# Patient Record
Sex: Male | Born: 1945 | Race: White | Hispanic: No | Marital: Married | Smoking: Never smoker
Health system: Southern US, Community
[De-identification: ages and names within clinical notes are randomized; demographics above are authoritative.]

## PROBLEM LIST (undated history)

## (undated) DIAGNOSIS — E785 Hyperlipidemia, unspecified: Secondary | ICD-10-CM

## (undated) DIAGNOSIS — E739 Lactose intolerance, unspecified: Secondary | ICD-10-CM

## (undated) DIAGNOSIS — H409 Unspecified glaucoma: Secondary | ICD-10-CM

## (undated) DIAGNOSIS — K219 Gastro-esophageal reflux disease without esophagitis: Secondary | ICD-10-CM

## (undated) DIAGNOSIS — I10 Essential (primary) hypertension: Secondary | ICD-10-CM

## (undated) DIAGNOSIS — I2510 Atherosclerotic heart disease of native coronary artery without angina pectoris: Secondary | ICD-10-CM

## (undated) DIAGNOSIS — H269 Unspecified cataract: Secondary | ICD-10-CM

## (undated) DIAGNOSIS — M1990 Unspecified osteoarthritis, unspecified site: Secondary | ICD-10-CM

## (undated) HISTORY — DX: Hyperlipidemia, unspecified: E78.5

## (undated) HISTORY — DX: Lactose intolerance, unspecified: E73.9

## (undated) HISTORY — DX: Unspecified cataract: H26.9

## (undated) HISTORY — PX: APPENDECTOMY: SHX54

## (undated) HISTORY — DX: Unspecified glaucoma: H40.9

## (undated) HISTORY — PX: EYE SURGERY: SHX253

## (undated) HISTORY — DX: Atherosclerotic heart disease of native coronary artery without angina pectoris: I25.10

## (undated) HISTORY — DX: Gastro-esophageal reflux disease without esophagitis: K21.9

## (undated) HISTORY — DX: Unspecified osteoarthritis, unspecified site: M19.90

## (undated) HISTORY — DX: Essential (primary) hypertension: I10

---

## 1999-01-28 ENCOUNTER — Encounter (INDEPENDENT_AMBULATORY_CARE_PROVIDER_SITE_OTHER): Payer: Self-pay | Admitting: Specialist

## 1999-01-28 ENCOUNTER — Other Ambulatory Visit: Admission: RE | Admit: 1999-01-28 | Discharge: 1999-01-28 | Payer: Self-pay | Admitting: Otolaryngology

## 2000-06-20 ENCOUNTER — Encounter: Admission: RE | Admit: 2000-06-20 | Discharge: 2000-06-20 | Payer: Self-pay | Admitting: Gastroenterology

## 2000-06-20 ENCOUNTER — Encounter: Payer: Self-pay | Admitting: Gastroenterology

## 2000-09-01 ENCOUNTER — Ambulatory Visit (HOSPITAL_COMMUNITY): Admission: RE | Admit: 2000-09-01 | Discharge: 2000-09-01 | Payer: Self-pay | Admitting: Gastroenterology

## 2003-12-19 ENCOUNTER — Ambulatory Visit: Payer: Self-pay | Admitting: Internal Medicine

## 2004-08-25 ENCOUNTER — Ambulatory Visit: Payer: Self-pay | Admitting: Internal Medicine

## 2004-12-31 ENCOUNTER — Ambulatory Visit: Payer: Self-pay | Admitting: Internal Medicine

## 2006-01-13 ENCOUNTER — Ambulatory Visit: Payer: Self-pay | Admitting: Internal Medicine

## 2006-01-20 LAB — CONVERTED CEMR LAB
Rheumatoid Fact: <20 intl units/mL — ABNORMAL LOW (ref 0.0–20.0)
Sed Rate: 4 mm/hr (ref 0–20)
Uric Acid, Serum: 6.8 mg/dL (ref 2.4–7.0)

## 2006-07-02 ENCOUNTER — Encounter: Payer: Self-pay | Admitting: Family Medicine

## 2006-09-29 ENCOUNTER — Ambulatory Visit: Payer: Self-pay | Admitting: Family Medicine

## 2007-05-29 ENCOUNTER — Ambulatory Visit: Payer: Self-pay | Admitting: Family Medicine

## 2008-02-01 ENCOUNTER — Ambulatory Visit: Payer: Self-pay | Admitting: Family Medicine

## 2008-02-15 HISTORY — PX: COLONOSCOPY: SHX174

## 2008-12-19 ENCOUNTER — Ambulatory Visit: Payer: Self-pay | Admitting: Family Medicine

## 2010-01-22 ENCOUNTER — Ambulatory Visit: Payer: Self-pay | Admitting: Family Medicine

## 2010-06-23 ENCOUNTER — Other Ambulatory Visit: Payer: Self-pay | Admitting: Gastroenterology

## 2010-06-25 ENCOUNTER — Other Ambulatory Visit: Payer: Self-pay

## 2010-07-02 ENCOUNTER — Ambulatory Visit
Admission: RE | Admit: 2010-07-02 | Discharge: 2010-07-02 | Disposition: A | Discharge status: Home / Self Care | Payer: BC Managed Care – PPO | Source: Ambulatory Visit | Attending: Gastroenterology | Admitting: Gastroenterology

## 2010-07-02 NOTE — Procedures (Signed)
[place]  Patient:    Jon Morales, Jon Morales                          MRN: [id number] Proc. Date: 09/01/00 Adm. Date:  [id number] Attending:  Orland Mustard CC:         Dr. Hortense Ramal. Alwyn Ren, M.D. [facility]   Procedure Report  PROCEDURE PERFORMED:  Esophagogastroduodenscopy.  ENDOSCOPIST:  Llana Aliment. Edwards, M.D.  MEDICATIONS:  Hurricaine spray, fentanyl 50 mcg, Versed 5 mg IV.  INDICATIONS:  Persistent dyspepsia.  DESCRIPTION OF PROCEDURE:  The procedure had been explained to the patient and consent obtained.  With the patient in the left lateral decubitus position, the Olympus adult video endoscope was inserted blindly in the esophagus and advanced under direct visualization.  The stomach was entered and the pylorus identified and passed.  The duodenum including the bulb and second portion were seen well and were unremarkable.  The scope was withdrawn back into the stomach.  The pyloric channel, antrum and body were all seen well and were normal.  No ulceration or masses.  Fundus and cardia seen on retroflex view and was normal.  The diaphragm approximately was at 40 cm.  There was approximately a 3 to 5 cm hiatal hernia with a patent GE junction.  The distal esophagus was endoscopically normal.  No ulceration, no evidence of Barretts esophagus.  The patient tolerated the procedure quite well, was maintained on low-flow oxygen and pulse oximeter throughout the procedure. ASSESSMENT:  Hiatal hernia with gastroesophageal reflux.  PLAN: Continue patient on Aciphex, antireflux instructions and see back in the office in six months. DD:  09/01/00 TD:  09/01/00 Job: [id number]

## 2010-09-30 ENCOUNTER — Encounter (HOSPITAL_COMMUNITY): Payer: BC Managed Care – PPO

## 2010-09-30 ENCOUNTER — Other Ambulatory Visit (INDEPENDENT_AMBULATORY_CARE_PROVIDER_SITE_OTHER): Payer: Self-pay | Admitting: Surgery

## 2010-09-30 LAB — SURGICAL PCR SCREEN
MRSA, PCR: NEGATIVE
Staphylococcus aureus: POSITIVE — AB

## 2010-10-06 ENCOUNTER — Ambulatory Visit (HOSPITAL_COMMUNITY)
Admission: RE | Admit: 2010-10-06 | Discharge: 2010-10-06 | Disposition: A | Discharge status: Home / Self Care | Payer: BC Managed Care – PPO | Source: Ambulatory Visit | Attending: Surgery | Admitting: Surgery

## 2010-10-06 DIAGNOSIS — Z01812 Encounter for preprocedural laboratory examination: Secondary | ICD-10-CM | POA: Insufficient documentation

## 2010-10-06 DIAGNOSIS — K449 Diaphragmatic hernia without obstruction or gangrene: Secondary | ICD-10-CM | POA: Insufficient documentation

## 2010-10-06 DIAGNOSIS — K4090 Unilateral inguinal hernia, without obstruction or gangrene, not specified as recurrent: Secondary | ICD-10-CM | POA: Insufficient documentation

## 2010-10-06 DIAGNOSIS — Z9089 Acquired absence of other organs: Secondary | ICD-10-CM | POA: Insufficient documentation

## 2010-10-06 HISTORY — PX: HERNIA REPAIR: SHX51

## 2010-10-07 NOTE — Op Note (Signed)
  NAMEMANUAL, NAVARRA NO.:  [id number]  MEDICAL RECORD NO.:  [id number]  LOCATION:  DAYL                         FACILITY:  [facility]  PHYSICIAN:  Velora Heckler, MD      DATE OF BIRTH:  1945-02-17  DATE OF PROCEDURE:  10/06/2010                               OPERATIVE REPORT   PREOPERATIVE DIAGNOSIS:  Left inguinal hernia.  POSTOPERATIVE DIAGNOSIS:  Left inguinal hernia.  PROCEDURE:  Repair of left inguinal hernia with Pro-Grip mesh.  SURGEON:  Velora Heckler, M.D., FACS  ANESTHESIA:  General.  ESTIMATED BLOOD LOSS:  Minimal.  PREPARATION:  ChloraPrep.  COMPLICATIONS:  None.  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  This has been present for approximately 10 years.  It has gradually increased in size and became mildly symptomatic.  The patient now presents for repair.  BODY OF REPORT:  Procedure was done in OR #6 at the [facility].  The patient was brought to the operating room, placed in supine position on the operating room table.  Following administration of general anesthesia, the patient was prepped and draped in the usual strict aseptic fashion.  After ascertaining that an adequate level of anesthesia been achieved, a left groin incision was made with a #15 blade.  Dissection was carried through subcutaneous tissues and hemostasis obtained with the electrocautery.  External oblique fascia was incised in line of its fibers and extended through the external inguinal ring.  Cord structures were dissected out of the inguinal canal and encircled with a Penrose drain for the inguinal canal was dissected out.  There was a moderate direct inguinal hernia, which was reduced and held in reduction with a 2-0 silk suture.  Next, the cord was explored.  A small lipoma was excised.  There was no evidence of indirect inguinal hernia sac.  Floor of the inguinal canal was  then recreated with a sheet of Pro-Grip mesh.  Mesh was secured to the pubic tubercle with a 2-0 Novofil suture. Inferior edge of the mesh was secured to the inguinal ligament with interrupted 2-0 Novofil sutures.  Mesh was deployed around the cord and laterally.  There was good approximation to the muscular wall.  Local field block was placed with Exparel.  External oblique fascia was closed with interrupted 3-0 Vicryl sutures.  Subcutaneous tissues were closed with interrupted 3-0 Vicryl sutures.  Skin was anesthetized with Exparel.  Skin edges were reapproximated with a running 4-0 Vicryl subcuticular suture.  Wound was washed and dried, and Benzoin and Steri- Strips were applied.  Sterile dressings were applied.  The patient was awakened from anesthesia and brought to the recovery room.  The patient tolerated the procedure well.   Velora Heckler, MD, FACS     TMG/MEDQ  D:  10/06/2010  T:  10/06/2010  Job:  [id number]  Electronically Signed by Darnell Level MD on 10/07/2010 02:14:48 PM

## 2010-10-19 ENCOUNTER — Encounter (INDEPENDENT_AMBULATORY_CARE_PROVIDER_SITE_OTHER): Payer: Self-pay | Admitting: Surgery

## 2010-10-19 ENCOUNTER — Ambulatory Visit (INDEPENDENT_AMBULATORY_CARE_PROVIDER_SITE_OTHER): Payer: BC Managed Care – PPO | Admitting: Surgery

## 2010-10-19 VITALS — BP 105/98 | HR 60

## 2010-10-19 DIAGNOSIS — K4090 Unilateral inguinal hernia, without obstruction or gangrene, not specified as recurrent: Secondary | ICD-10-CM

## 2010-10-19 NOTE — Patient Instructions (Signed)
  COCOA BUTTER & VITAMIN E CREAM  ([employer] or other brand)  Apply cocoa butter/vitamin E cream to your incision 2 - 3 times daily.  Massage cream into incision for one minute with each application.  Use sunscreen (50 SPF or higher) for first 6 months after surgery.  You may substitute Mederma or other scar reducing creams as desired.   

## 2010-10-19 NOTE — Progress Notes (Signed)
Visit Diagnoses: 1. Inguinal hernia unilateral, non-recurrent     HISTORY: Patient returns for her first postoperative visit. He had problems initially with urinary retention but this resolved after 2 days of catheterization.   EXAM: Surgical wound is well-healed. Moderate soft tissue swelling. No sign of infection. With Valsalva there is no sign of recurrence.   IMPRESSION: Status post left inguinal hernia repair with mesh   PLAN: Patient will begin increasing his activity level. His lifting is restricted to 20 pounds. He will return to see me for a final wound check in 6 weeks.   Velora Heckler, MD, FACS General & Endocrine Surgery [facility]

## 2010-12-06 ENCOUNTER — Ambulatory Visit (INDEPENDENT_AMBULATORY_CARE_PROVIDER_SITE_OTHER): Payer: BC Managed Care – PPO | Admitting: Surgery

## 2010-12-06 ENCOUNTER — Encounter (INDEPENDENT_AMBULATORY_CARE_PROVIDER_SITE_OTHER): Payer: Self-pay | Admitting: Surgery

## 2010-12-06 VITALS — BP 138/88 | HR 80 | Temp 97.3°F | Resp 20 | Ht 70.0 in | Wt 166.1 lb

## 2010-12-06 DIAGNOSIS — K4090 Unilateral inguinal hernia, without obstruction or gangrene, not specified as recurrent: Secondary | ICD-10-CM

## 2010-12-06 NOTE — Patient Instructions (Signed)
  COCOA BUTTER & VITAMIN E CREAM  ([employer] or other brand)  Apply cocoa butter/vitamin E cream to your incision 2 - 3 times daily.  Massage cream into incision for one minute with each application.  Use sunscreen (50 SPF or higher) for first 6 months after surgery.  You may substitute Mederma or other scar reducing creams as desired.   

## 2010-12-06 NOTE — Progress Notes (Signed)
Visit Diagnoses: 1. Inguinal hernia unilateral, non-recurrent     HISTORY: Patient returns for a final wound check having undergone left inguinal hernia repair with mesh. Patient has minor discomfort in the left groin with bending at the waist. He notes that his bowel movements have improved. He denies any sharp pain or discomfort.   EXAM: Surgical wound is well healed. No sign of infection. Minimal swelling. Outpatient in the inguinal canal with cough and Valsalva shows no sign of recurrent hernia.   IMPRESSION: Status post left inguinal hernia repair with mesh   PLAN: Patient will apply topical creams to the incision. He is unrestricted in his activities at this point. He will return to see me as needed.   Velora Heckler, MD, FACS General & Endocrine Surgery [facility]

## 2011-02-18 ENCOUNTER — Encounter: Payer: Self-pay | Admitting: Internal Medicine

## 2011-02-25 ENCOUNTER — Ambulatory Visit (INDEPENDENT_AMBULATORY_CARE_PROVIDER_SITE_OTHER): Payer: BC Managed Care – PPO | Admitting: Family Medicine

## 2011-02-25 ENCOUNTER — Encounter: Payer: Self-pay | Admitting: Family Medicine

## 2011-02-25 VITALS — BP 122/82 | HR 82 | Ht 70.0 in | Wt 162.0 lb

## 2011-02-25 DIAGNOSIS — E739 Lactose intolerance, unspecified: Secondary | ICD-10-CM

## 2011-02-25 DIAGNOSIS — R160 Hepatomegaly, not elsewhere classified: Secondary | ICD-10-CM

## 2011-02-25 DIAGNOSIS — K219 Gastro-esophageal reflux disease without esophagitis: Secondary | ICD-10-CM

## 2011-02-25 DIAGNOSIS — Z0000 Encounter for general adult medical examination without abnormal findings: Secondary | ICD-10-CM

## 2011-02-25 LAB — POC HEMOCCULT BLD/STL (OFFICE/1-CARD/DIAGNOSTIC): Fecal Occult Blood, POC: NEGATIVE

## 2011-02-25 NOTE — Progress Notes (Signed)
  Subjective:    Patient ID: Jon Morales, male    DOB: Aug 18, 1945, 66 y.o.   MRN: [id number]  HPI He is here for complete examination. He has no particular concerns or complaints. Does have occasional difficulty with reflux symptoms. He did have internal hemorrhoid banding done earlier this year. He also has a history of lactose intolerance. He is now working 3-1/2 days per week. Jon Morales is going well.   Review of Systems  Constitutional: Negative.   HENT: Negative.   Eyes: Negative.   Respiratory: Negative.   Cardiovascular: Negative.   Gastrointestinal: Negative.   Genitourinary: Negative.   Musculoskeletal: Negative.   Skin: Negative.   Neurological: Negative.   Hematological: Negative.   Psychiatric/Behavioral: Negative.        Objective:   Physical Exam BP 122/82  Pulse 82  Ht 5\' 10"  (1.778 m)  Wt 162 lb (73.483 kg)  BMI 23.24 kg/m2  General Appearance:    Alert, cooperative, no distress, appears stated age  Head:    Normocephalic, without obvious abnormality, atraumatic  Eyes:    PERRL, conjunctiva/corneas clear, EOM's intact, fundi    benign  Ears:    Normal TM's and external ear canals  Nose:   Nares normal, mucosa normal, no drainage or sinus   tenderness  Throat:   Lips, mucosa, and tongue normal; teeth and gums normal  Neck:   Supple, no lymphadenopathy;  thyroid:  no   enlargement/tenderness/nodules; no carotid   bruit or JVD  Back:    Spine nontender, no curvature, ROM normal, no CVA     tenderness  Lungs:     Clear to auscultation bilaterally without wheezes, rales or     ronchi; respirations unlabored  Chest Wall:    No tenderness or deformity   Heart:    Regular rate and rhythm, S1 and S2 normal, no murmur, rub   or gallop  Breast Exam:    No chest wall tenderness, masses or gynecomastia  Abdomen:     Soft, non-tender, nondistended, normoactive bowel sounds,    no masses, no hepatosplenomegaly  Genitalia:    Normal male external genitalia without lesions.   Testicles without masses.  No inguinal hernias.  Rectal:    Normal sphincter tone, no masses or tenderness; guaiac negative stool.  Prostate smooth, no nodules, not enlarged.  Extremities:   No clubbing, cyanosis or edema  Pulses:   2+ and symmetric all extremities  Skin:   Skin color, texture, turgor normal, no rashes or lesions  Lymph nodes:   Cervical, supraclavicular, and axillary nodes normal  Neurologic:   CNII-XII intact, normal strength, sensation and gait; reflexes 2+ and symmetric throughout          Psych:   Normal mood, affect, hygiene and grooming.           Assessment & Plan:   1. Routine general medical examination at a health care facility  CBC with Differential, Comprehensive metabolic panel, Lipid panel  2. GERD (gastroesophageal reflux disease)    3. Hepatomegaly  CBC with Differential, Comprehensive metabolic panel  4. Lactase deficiency     review of his record indicates he has had a recent ultrasound did show fatty infiltration. His drinking is very minimal and he is not overweight

## 2011-02-25 NOTE — Patient Instructions (Signed)
We will call you with the results of the blood work.   

## 2011-02-26 LAB — CBC WITH DIFFERENTIAL/PLATELET
Basophils Absolute: 0 10*3/uL (ref 0.0–0.1)
Basophils Relative: 0 % (ref 0–1)
Eosinophils Absolute: 0.1 10*3/uL (ref 0.0–0.7)
Eosinophils Relative: 2 % (ref 0–5)
HCT: 48.2 % (ref 39.0–52.0)
Hemoglobin: 16 g/dL (ref 13.0–17.0)
Lymphocytes Relative: 32 % (ref 12–46)
Lymphs Abs: 2.2 10*3/uL (ref 0.7–4.0)
MCH: 30.2 pg (ref 26.0–34.0)
MCHC: 33.2 g/dL (ref 30.0–36.0)
MCV: 90.9 fL (ref 78.0–100.0)
Monocytes Absolute: 0.6 10*3/uL (ref 0.1–1.0)
Monocytes Relative: 9 % (ref 3–12)
Neutro Abs: 4.1 10*3/uL (ref 1.7–7.7)
Neutrophils Relative %: 57 % (ref 43–77)
Platelets: 283 10*3/uL (ref 150–400)
RBC: 5.3 MIL/uL (ref 4.22–5.81)
RDW: 13.3 % (ref 11.5–15.5)
WBC: 7.1 10*3/uL (ref 4.0–10.5)

## 2011-02-26 LAB — COMPREHENSIVE METABOLIC PANEL
ALT: 50 U/L (ref 0–53)
AST: 33 U/L (ref 0–37)
Albumin: 5.2 g/dL (ref 3.5–5.2)
Alkaline Phosphatase: 48 U/L (ref 39–117)
BUN: 17 mg/dL (ref 6–23)
CO2: 26 mEq/L (ref 19–32)
Calcium: 9.7 mg/dL (ref 8.4–10.5)
Chloride: 104 mEq/L (ref 96–112)
Creat: 1.08 mg/dL (ref 0.50–1.35)
Glucose, Bld: 105 mg/dL — ABNORMAL HIGH (ref 70–99)
Potassium: 4.4 mEq/L (ref 3.5–5.3)
Sodium: 141 mEq/L (ref 135–145)
Total Bilirubin: 0.5 mg/dL (ref 0.3–1.2)
Total Protein: 7.3 g/dL (ref 6.0–8.3)

## 2011-02-26 LAB — LIPID PANEL
Cholesterol: 212 mg/dL — ABNORMAL HIGH (ref 0–200)
HDL: 73 mg/dL (ref >39)
LDL Cholesterol: 123 mg/dL — ABNORMAL HIGH (ref 0–99)
Total CHOL/HDL Ratio: 2.9 Ratio
Triglycerides: 80 mg/dL (ref <150)
VLDL: 16 mg/dL (ref 0–40)

## 2012-03-30 ENCOUNTER — Ambulatory Visit (INDEPENDENT_AMBULATORY_CARE_PROVIDER_SITE_OTHER): Payer: BC Managed Care – PPO | Admitting: Family Medicine

## 2012-03-30 ENCOUNTER — Encounter: Payer: Self-pay | Admitting: Family Medicine

## 2012-03-30 VITALS — BP 130/82 | HR 100 | Temp 97.9°F | Wt 166.0 lb

## 2012-03-30 DIAGNOSIS — J019 Acute sinusitis, unspecified: Secondary | ICD-10-CM

## 2012-03-30 MED ORDER — AMOXICILLIN 875 MG PO TABS: 875.0000 mg | ORAL_TABLET | Freq: Two times a day (BID) | ORAL | Status: DC

## 2012-03-30 NOTE — Progress Notes (Signed)
  Subjective:    Patient ID: Jon Morales, male    DOB: [birth year], 67 y.o.   MRN: [id number]  HPI He has a 4 day history of nasal congestion with some bleeding, slight hoarse voice,PND, headache upper tooth discomfort and, upper tooth discomfort.   Review of Systems     Objective:   Physical Exam alert and in no distress. Tympanic membranes and canals are normal. Throat is clear. Tonsils are normal. Neck is supple without adenopathy or thyromegaly. Cardiac exam shows a regular sinus rhythm without murmurs or gallops. Lungs are clear to auscultation. Nasal mucosa slightly red with tenderness especially over maxillary sinuses       Assessment & Plan:  Acute sinusitis - Plan: amoxicillin (AMOXIL) 875 MG tablet he is to call if not completely better when he finishes the antibiotic.

## 2012-04-06 ENCOUNTER — Encounter: Payer: BC Managed Care – PPO | Admitting: Family Medicine

## 2012-08-15 DIAGNOSIS — C44211 Basal cell carcinoma of skin of unspecified ear and external auricular canal: Secondary | ICD-10-CM | POA: Diagnosis not present

## 2012-08-15 DIAGNOSIS — D485 Neoplasm of uncertain behavior of skin: Secondary | ICD-10-CM | POA: Diagnosis not present

## 2012-08-15 DIAGNOSIS — I781 Nevus, non-neoplastic: Secondary | ICD-10-CM | POA: Diagnosis not present

## 2012-08-15 DIAGNOSIS — L570 Actinic keratosis: Secondary | ICD-10-CM | POA: Diagnosis not present

## 2012-08-15 DIAGNOSIS — L821 Other seborrheic keratosis: Secondary | ICD-10-CM | POA: Diagnosis not present

## 2012-08-15 DIAGNOSIS — L219 Seborrheic dermatitis, unspecified: Secondary | ICD-10-CM | POA: Diagnosis not present

## 2012-09-04 ENCOUNTER — Ambulatory Visit (INDEPENDENT_AMBULATORY_CARE_PROVIDER_SITE_OTHER): Payer: Medicare Other | Admitting: Family Medicine

## 2012-09-04 ENCOUNTER — Encounter: Payer: Self-pay | Admitting: Family Medicine

## 2012-09-04 VITALS — BP 130/80 | HR 90 | Wt 165.0 lb

## 2012-09-04 DIAGNOSIS — W57XXXA Bitten or stung by nonvenomous insect and other nonvenomous arthropods, initial encounter: Secondary | ICD-10-CM | POA: Diagnosis not present

## 2012-09-04 DIAGNOSIS — R197 Diarrhea, unspecified: Secondary | ICD-10-CM | POA: Diagnosis not present

## 2012-09-04 DIAGNOSIS — T148 Other injury of unspecified body region: Secondary | ICD-10-CM | POA: Diagnosis not present

## 2012-09-04 MED ORDER — METRONIDAZOLE 500 MG PO TABS: 500.0000 mg | ORAL_TABLET | Freq: Three times a day (TID) | ORAL | Status: DC

## 2012-09-04 NOTE — Progress Notes (Signed)
  Subjective:    Patient ID: Jon Morales, male    DOB: Jun 20, 1945, 67 y.o.   MRN: [id number]  HPI He is here for evaluation of tick bites and diarrhea. He went to [place] over the Easter break and has been having intermittent loose stools with abdominal cramping since then. No fever, chills, bloody BMs. He has been there before. He did take a probiotic or continues to have difficulty. He was told that if that didn't work, stool evaluation might be needed. He also has had tick bites on at least 3 different occasions, 2 are definitely deer tick. He did not develop a rash. He mainly complains of fatigue the   Review of Systems     Objective:   Physical Exam alert and in no distress. Tympanic membranes and canals are normal. Throat is clear. Tonsils are normal. Neck is supple without adenopathy or thyromegaly. Cardiac exam shows a regular sinus rhythm without murmurs or gallops. Lungs are clear to auscultation. No rashes noted        Assessment & Plan:  Diarrhea - Plan: metroNIDAZOLE (FLAGYL) 500 MG tablet  Tick bite  I discussed the treatment of the diarrhea with him. He has had previous experience with metronidazole after a trip to [place] and I think this is reasonable. Explained that cultures are notoriously poor return of the investment. Also discussed the tick bites and at this time I do not think he is at risk for Lyme disease or RMSF. I discussed the need for him to avoid alcohol while taking the metronidazole

## 2012-09-06 ENCOUNTER — Encounter: Payer: Self-pay | Admitting: Family Medicine

## 2012-10-05 ENCOUNTER — Telehealth: Payer: Self-pay | Admitting: Family Medicine

## 2012-10-05 NOTE — Telephone Encounter (Signed)
PT ADVISED OF THE PROCESS WAS GIVEN 2 SETS OF TUBES 2 HATS AND 2 RX ALONG WITH DIRECTIONS AND WHERE TO TAKE 2 SEP. STOOLS

## 2012-10-05 NOTE — Telephone Encounter (Signed)
Set him up for stool for ova and parasites, culture X2 radial probably need to work through [place] on this

## 2012-10-08 DIAGNOSIS — R197 Diarrhea, unspecified: Secondary | ICD-10-CM | POA: Diagnosis not present

## 2012-10-22 ENCOUNTER — Encounter: Payer: Medicare Other | Admitting: Family Medicine

## 2012-11-02 ENCOUNTER — Encounter: Payer: Self-pay | Admitting: Family Medicine

## 2012-11-02 ENCOUNTER — Ambulatory Visit (INDEPENDENT_AMBULATORY_CARE_PROVIDER_SITE_OTHER): Payer: Medicare Other | Admitting: Family Medicine

## 2012-11-02 VITALS — BP 110/70 | HR 80 | Ht 70.0 in | Wt 165.0 lb

## 2012-11-02 DIAGNOSIS — E785 Hyperlipidemia, unspecified: Secondary | ICD-10-CM | POA: Diagnosis not present

## 2012-11-02 DIAGNOSIS — Z0000 Encounter for general adult medical examination without abnormal findings: Secondary | ICD-10-CM | POA: Diagnosis not present

## 2012-11-02 DIAGNOSIS — Z87898 Personal history of other specified conditions: Secondary | ICD-10-CM

## 2012-11-02 DIAGNOSIS — Z8719 Personal history of other diseases of the digestive system: Secondary | ICD-10-CM | POA: Diagnosis not present

## 2012-11-02 LAB — POCT URINALYSIS DIPSTICK
Bilirubin, UA: NEGATIVE
Blood, UA: NEGATIVE
Glucose, UA: NEGATIVE
Spec Grav, UA: 1.01
Urobilinogen, UA: NEGATIVE

## 2012-11-02 LAB — CBC WITH DIFFERENTIAL/PLATELET
Eosinophils Absolute: 0.2 10*3/uL (ref 0.0–0.7)
Eosinophils Relative: 2 % (ref 0–5)
Hemoglobin: 16.3 g/dL (ref 13.0–17.0)
Lymphs Abs: 3 10*3/uL (ref 0.7–4.0)
MCH: 30.2 pg (ref 26.0–34.0)
MCV: 88.1 fL (ref 78.0–100.0)
Monocytes Absolute: 0.8 10*3/uL (ref 0.1–1.0)
Monocytes Relative: 10 % (ref 3–12)
RBC: 5.39 MIL/uL (ref 4.22–5.81)

## 2012-11-02 LAB — COMPREHENSIVE METABOLIC PANEL
CO2: 30 mEq/L (ref 19–32)
Creat: 1.18 mg/dL (ref 0.50–1.35)
Glucose, Bld: 94 mg/dL (ref 70–99)
Total Bilirubin: 0.6 mg/dL (ref 0.3–1.2)
Total Protein: 7.3 g/dL (ref 6.0–8.3)

## 2012-11-02 LAB — LIPID PANEL
Cholesterol: 203 mg/dL — ABNORMAL HIGH (ref 0–200)
Total CHOL/HDL Ratio: 2.9 Ratio
Triglycerides: 107 mg/dL (ref <150)
VLDL: 21 mg/dL (ref 0–40)

## 2012-11-02 LAB — HEMOCCULT GUIAC POC 1CARD (OFFICE)

## 2012-11-02 NOTE — Progress Notes (Signed)
  Subjective:    Patient ID: Jon Morales, male    DOB: 06-05-1945, 67 y.o.   MRN: [id number]  HPI He is here for complete examination. He has had difficulty recently with loose stools. He was placed on Flagyl. Since then he has had intermittent loose stools but seems to be getting slightly better. He has no other concerns or complaints. He is not on any medications. His immunizations were reviewed and he is not interested in having any immunization update. He does have slightly elevated lipid panel from his last blood draw. 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 Smoking and drinking were reviewed. He does keep himself physically active. He did have a hernia repair and is doing well at this point.  Review of Systems Negative except as above.    Objective:   Physical Exam BP 110/70  Pulse 80  Ht 5\' 10"  (1.778 m)  Wt 165 lb (74.844 kg)  BMI 23.68 kg/m2  SpO2 98%  General Appearance:    Alert, cooperative, no distress, appears stated age  Head:    Normocephalic, without obvious abnormality, atraumatic  Eyes:    PERRL, conjunctiva/corneas clear, EOM's intact, fundi    benign  Ears:    Normal TM's and external ear canals  Nose:   Nares normal, mucosa normal, no drainage or sinus   tenderness  Throat:   Lips, mucosa, and tongue normal; teeth and gums normal  Neck:   Supple, no lymphadenopathy;  thyroid:  no   enlargement/tenderness/nodules; no carotid   bruit or JVD  Back:    Spine nontender, no curvature, ROM normal, no CVA     tenderness  Lungs:     Clear to auscultation bilaterally without wheezes, rales or     ronchi; respirations unlabored  Chest Wall:    No tenderness or deformity   Heart:    Regular rate and rhythm, S1 and S2 normal, no murmur, rub   or gallop  Breast Exam:    No chest wall tenderness, masses or gynecomastia  Abdomen:     Soft, non-tender, nondistended, normoactive bowel sounds,    no masses, no  hepatosplenomegaly     Rectal:    Normal sphincter tone, no masses or tenderness; guaiac negative stool.  Prostate smooth, no nodules, not enlarged.  Extremities:   No clubbing, cyanosis or edema  Pulses:   2+ and symmetric all extremities  Skin:   Skin color, texture, turgor normal, no rashes or lesions  Lymph nodes:   Cervical, supraclavicular, and axillary nodes normal  Neurologic:   CNII-XII intact, normal strength, sensation and gait; reflexes 2+ and symmetric throughout          Psych:   Normal mood, affect, hygiene and grooming.          Assessment & Plan:  Routine general medical examination at a health care facility - Plan: POCT Urinalysis Dipstick, CBC with Differential, Comprehensive metabolic panel, Lipid panel, Hemoccult - 1 Card (office)  Hyperlipidemia LDL goal < 130 - Plan: Lipid panel  History of diarrhea - Plan: CBC with Differential, Comprehensive metabolic panel  discussed immunizations with him which he has declined. Encouraged him to continue to take good care of himself.

## 2012-11-05 NOTE — Progress Notes (Signed)
Quick Note:  Left message on pt cell # labs look good ______

## 2012-11-09 DIAGNOSIS — C44211 Basal cell carcinoma of skin of unspecified ear and external auricular canal: Secondary | ICD-10-CM | POA: Diagnosis not present

## 2012-12-20 ENCOUNTER — Other Ambulatory Visit: Payer: Self-pay

## 2013-04-30 DIAGNOSIS — R490 Dysphonia: Secondary | ICD-10-CM | POA: Diagnosis not present

## 2013-05-10 ENCOUNTER — Encounter: Payer: Self-pay | Admitting: Family Medicine

## 2013-05-10 ENCOUNTER — Ambulatory Visit (INDEPENDENT_AMBULATORY_CARE_PROVIDER_SITE_OTHER): Payer: Medicare Other | Admitting: Family Medicine

## 2013-05-10 VITALS — BP 102/90 | HR 60 | Wt 167.0 lb

## 2013-05-10 DIAGNOSIS — R1011 Right upper quadrant pain: Secondary | ICD-10-CM | POA: Diagnosis not present

## 2013-05-10 DIAGNOSIS — R5381 Other malaise: Secondary | ICD-10-CM | POA: Diagnosis not present

## 2013-05-10 DIAGNOSIS — G479 Sleep disorder, unspecified: Secondary | ICD-10-CM

## 2013-05-10 DIAGNOSIS — R5383 Other fatigue: Secondary | ICD-10-CM

## 2013-05-10 LAB — CBC WITH DIFFERENTIAL/PLATELET
BASOS ABS: 0.1 10*3/uL (ref 0.0–0.1)
Basophils Relative: 1 % (ref 0–1)
EOS ABS: 0.1 10*3/uL (ref 0.0–0.7)
Eosinophils Relative: 2 % (ref 0–5)
HCT: 46.4 % (ref 39.0–52.0)
HEMOGLOBIN: 15.9 g/dL (ref 13.0–17.0)
Lymphocytes Relative: 44 % (ref 12–46)
Lymphs Abs: 2.4 10*3/uL (ref 0.7–4.0)
MCH: 29.5 pg (ref 26.0–34.0)
MCHC: 34.3 g/dL (ref 30.0–36.0)
MCV: 86.1 fL (ref 78.0–100.0)
MONOS PCT: 12 % (ref 3–12)
Monocytes Absolute: 0.6 10*3/uL (ref 0.1–1.0)
Neutro Abs: 2.2 10*3/uL (ref 1.7–7.7)
Neutrophils Relative %: 41 % — ABNORMAL LOW (ref 43–77)
Platelets: 265 10*3/uL (ref 150–400)
RBC: 5.39 MIL/uL (ref 4.22–5.81)
RDW: 13.5 % (ref 11.5–15.5)
WBC: 5.4 10*3/uL (ref 4.0–10.5)

## 2013-05-10 LAB — COMPREHENSIVE METABOLIC PANEL
ALK PHOS: 55 U/L (ref 39–117)
ALT: 50 U/L (ref 0–53)
AST: 34 U/L (ref 0–37)
Albumin: 4.3 g/dL (ref 3.5–5.2)
BILIRUBIN TOTAL: 0.4 mg/dL (ref 0.2–1.2)
BUN: 17 mg/dL (ref 6–23)
CO2: 27 mEq/L (ref 19–32)
Calcium: 9.2 mg/dL (ref 8.4–10.5)
Chloride: 103 mEq/L (ref 96–112)
Creat: 1.02 mg/dL (ref 0.50–1.35)
GLUCOSE: 92 mg/dL (ref 70–99)
Potassium: 4.3 mEq/L (ref 3.5–5.3)
Sodium: 138 mEq/L (ref 135–145)
Total Protein: 6.9 g/dL (ref 6.0–8.3)

## 2013-05-10 MED ORDER — ZOLPIDEM TARTRATE 5 MG PO TABS: 5.0000 mg | ORAL_TABLET | Freq: Every evening | ORAL | Status: DC | PRN

## 2013-05-10 NOTE — Progress Notes (Signed)
   Subjective:    Patient ID: Jon Morales, male    DOB: [birth year], 68 y.o.   MRN: [id number]  HPI He is here for a recheck. He does complain of continued difficulty with nausea as well as right upper corner discomfort. He also has had loose stools. Greasy foods do tend to make this worse. He also has some PND and has seen ENT in the past for this. He also notes fatigue but no skin or hair changes. His libido is normal according to him. He has noted at least on one occasion some spasm in one of his hands. He would also like a refill on Ambien. He uses this very sparingly.  Review of Systems     Objective:   Physical Exam alert and in no distress. Tympanic membranes and canals are normal. Throat is clear. Tonsils are normal. Neck is supple without adenopathy or thyromegaly. Cardiac exam shows a regular sinus rhythm without murmurs or gallops. Lungs are clear to auscultation. Abdominal exam shows negative Murphy's sign and Murphy's punch however the liver was palpable with inspiration. He apparently has had a previous ultrasound which did show fatty liver. Negative Chvostek's sign. DTRs are 2-3+ bilaterally skin shows no changes..diag        Assessment & Plan:  Abdominal pain, right upper quadrant - Plan: US Abdomen Limited  Fatigue - Plan: CBC with Differential, Comprehensive metabolic panel, Testosterone, TSH  Sleep disturbance - Plan: zolpidem (AMBIEN) 5 MG tablet

## 2013-05-11 LAB — TSH: TSH: 2.711 u[IU]/mL (ref 0.350–4.500)

## 2013-05-11 LAB — TESTOSTERONE: Testosterone: 245 ng/dL — ABNORMAL LOW (ref 300–890)

## 2013-05-14 ENCOUNTER — Ambulatory Visit
Admission: RE | Admit: 2013-05-14 | Discharge: 2013-05-14 | Disposition: A | Discharge status: Home / Self Care | Payer: Medicare Other | Source: Ambulatory Visit | Attending: Family Medicine | Admitting: Family Medicine

## 2013-05-14 ENCOUNTER — Other Ambulatory Visit: Payer: Self-pay

## 2013-05-14 DIAGNOSIS — K7689 Other specified diseases of liver: Secondary | ICD-10-CM | POA: Diagnosis not present

## 2013-05-14 DIAGNOSIS — R1011 Right upper quadrant pain: Secondary | ICD-10-CM

## 2013-05-20 ENCOUNTER — Ambulatory Visit (HOSPITAL_COMMUNITY): Payer: Medicare Other

## 2013-05-31 ENCOUNTER — Encounter (HOSPITAL_COMMUNITY)
Admission: RE | Admit: 2013-05-31 | Discharge: 2013-05-31 | Disposition: A | Discharge status: Home / Self Care | Payer: Medicare Other | Source: Ambulatory Visit | Attending: Family Medicine | Admitting: Family Medicine

## 2013-05-31 DIAGNOSIS — R110 Nausea: Secondary | ICD-10-CM | POA: Diagnosis not present

## 2013-05-31 DIAGNOSIS — R1011 Right upper quadrant pain: Secondary | ICD-10-CM | POA: Diagnosis not present

## 2013-05-31 MED ORDER — STERILE WATER FOR INJECTION IJ SOLN
INTRAMUSCULAR | Status: AC
  Filled 2013-05-31: qty 10

## 2013-05-31 MED ORDER — SINCALIDE 5 MCG IJ SOLR
INTRAMUSCULAR | Status: AC
  Filled 2013-05-31: qty 5

## 2013-05-31 MED ORDER — SINCALIDE 5 MCG IJ SOLR
0.0200 ug/kg | Freq: Once | INTRAMUSCULAR | Status: AC
  Administered 2013-05-31: 1.5 ug via INTRAVENOUS

## 2013-05-31 MED ORDER — TECHNETIUM TC 99M MEBROFENIN IV KIT
5.0000 | PACK | Freq: Once | INTRAVENOUS | Status: AC | PRN
  Administered 2013-05-31: 5 via INTRAVENOUS

## 2013-07-02 DIAGNOSIS — H269 Unspecified cataract: Secondary | ICD-10-CM | POA: Diagnosis not present

## 2013-09-04 DIAGNOSIS — L570 Actinic keratosis: Secondary | ICD-10-CM | POA: Diagnosis not present

## 2013-09-04 DIAGNOSIS — Z85828 Personal history of other malignant neoplasm of skin: Secondary | ICD-10-CM | POA: Diagnosis not present

## 2013-09-04 DIAGNOSIS — L821 Other seborrheic keratosis: Secondary | ICD-10-CM | POA: Diagnosis not present

## 2013-09-04 DIAGNOSIS — L608 Other nail disorders: Secondary | ICD-10-CM | POA: Diagnosis not present

## 2013-09-06 DIAGNOSIS — R490 Dysphonia: Secondary | ICD-10-CM | POA: Diagnosis not present

## 2014-06-13 ENCOUNTER — Other Ambulatory Visit: Payer: Self-pay | Admitting: Gastroenterology

## 2014-06-13 DIAGNOSIS — R1011 Right upper quadrant pain: Secondary | ICD-10-CM

## 2014-06-20 ENCOUNTER — Ambulatory Visit
Admission: RE | Admit: 2014-06-20 | Discharge: 2014-06-20 | Disposition: A | Discharge status: Home / Self Care | Payer: Medicare Other | Source: Ambulatory Visit | Attending: Gastroenterology | Admitting: Gastroenterology

## 2014-06-20 DIAGNOSIS — R1011 Right upper quadrant pain: Secondary | ICD-10-CM

## 2014-06-20 DIAGNOSIS — K760 Fatty (change of) liver, not elsewhere classified: Secondary | ICD-10-CM | POA: Diagnosis not present

## 2014-08-11 ENCOUNTER — Other Ambulatory Visit: Payer: Self-pay

## 2014-10-08 DIAGNOSIS — Z85828 Personal history of other malignant neoplasm of skin: Secondary | ICD-10-CM | POA: Diagnosis not present

## 2014-10-08 DIAGNOSIS — L570 Actinic keratosis: Secondary | ICD-10-CM | POA: Diagnosis not present

## 2014-10-08 DIAGNOSIS — L821 Other seborrheic keratosis: Secondary | ICD-10-CM | POA: Diagnosis not present

## 2014-10-16 ENCOUNTER — Telehealth: Payer: Self-pay

## 2014-10-16 NOTE — Telephone Encounter (Signed)
Called pt home # left message to call back per [facility] pt needs a med check or physical

## 2014-11-17 DIAGNOSIS — J328 Other chronic sinusitis: Secondary | ICD-10-CM | POA: Diagnosis not present

## 2014-12-19 DIAGNOSIS — H524 Presbyopia: Secondary | ICD-10-CM | POA: Diagnosis not present

## 2014-12-19 DIAGNOSIS — H52223 Regular astigmatism, bilateral: Secondary | ICD-10-CM | POA: Diagnosis not present

## 2014-12-19 DIAGNOSIS — H5201 Hypermetropia, right eye: Secondary | ICD-10-CM | POA: Diagnosis not present

## 2014-12-19 DIAGNOSIS — H25813 Combined forms of age-related cataract, bilateral: Secondary | ICD-10-CM | POA: Diagnosis not present

## 2014-12-19 DIAGNOSIS — H5212 Myopia, left eye: Secondary | ICD-10-CM | POA: Diagnosis not present

## 2014-12-26 ENCOUNTER — Telehealth: Payer: Self-pay | Admitting: Family Medicine

## 2014-12-26 NOTE — Telephone Encounter (Signed)
Left message to try call back to schedule a cpe

## 2014-12-29 DIAGNOSIS — K219 Gastro-esophageal reflux disease without esophagitis: Secondary | ICD-10-CM | POA: Diagnosis not present

## 2014-12-29 DIAGNOSIS — K5730 Diverticulosis of large intestine without perforation or abscess without bleeding: Secondary | ICD-10-CM | POA: Diagnosis not present

## 2014-12-29 DIAGNOSIS — K648 Other hemorrhoids: Secondary | ICD-10-CM | POA: Diagnosis not present

## 2014-12-29 DIAGNOSIS — K642 Third degree hemorrhoids: Secondary | ICD-10-CM | POA: Diagnosis not present

## 2014-12-29 DIAGNOSIS — K449 Diaphragmatic hernia without obstruction or gangrene: Secondary | ICD-10-CM | POA: Diagnosis not present

## 2014-12-29 DIAGNOSIS — Z1211 Encounter for screening for malignant neoplasm of colon: Secondary | ICD-10-CM | POA: Diagnosis not present

## 2014-12-29 LAB — HM COLONOSCOPY

## 2015-01-01 ENCOUNTER — Encounter: Payer: Self-pay | Admitting: Family Medicine

## 2015-01-13 DIAGNOSIS — H40013 Open angle with borderline findings, low risk, bilateral: Secondary | ICD-10-CM | POA: Diagnosis not present

## 2015-06-23 DIAGNOSIS — J310 Chronic rhinitis: Secondary | ICD-10-CM | POA: Diagnosis not present

## 2015-06-23 DIAGNOSIS — J018 Other acute sinusitis: Secondary | ICD-10-CM | POA: Diagnosis not present

## 2015-07-28 ENCOUNTER — Ambulatory Visit (INDEPENDENT_AMBULATORY_CARE_PROVIDER_SITE_OTHER): Payer: Medicare Other | Admitting: Family Medicine

## 2015-07-28 ENCOUNTER — Encounter: Payer: Self-pay | Admitting: Family Medicine

## 2015-07-28 VITALS — BP 116/80 | HR 110 | Wt 169.0 lb

## 2015-07-28 DIAGNOSIS — J0110 Acute frontal sinusitis, unspecified: Secondary | ICD-10-CM | POA: Diagnosis not present

## 2015-07-28 DIAGNOSIS — K219 Gastro-esophageal reflux disease without esophagitis: Secondary | ICD-10-CM

## 2015-07-28 DIAGNOSIS — R5383 Other fatigue: Secondary | ICD-10-CM

## 2015-07-28 DIAGNOSIS — H40059 Ocular hypertension, unspecified eye: Secondary | ICD-10-CM | POA: Diagnosis not present

## 2015-07-28 MED ORDER — OMEPRAZOLE 40 MG PO CPDR: 40.0000 mg | DELAYED_RELEASE_CAPSULE | Freq: Every day | ORAL | Status: DC

## 2015-07-28 MED ORDER — AMOXICILLIN-POT CLAVULANATE 875-125 MG PO TABS: 1.0000 | ORAL_TABLET | Freq: Two times a day (BID) | ORAL | Status: DC

## 2015-07-28 NOTE — Progress Notes (Signed)
Subjective:     Patient ID: Jon Morales, male   DOB: 1945/04/02, 70 y.o.   MRN: [id number]  HPI [name] has a history of GERD with cough and sinus infection ~1 month ago who presents to clinic today with a 1 month history of dry cough, fatigue, hoarseness, burning sensation in the roof of his mouth, frontal headache, night sweats, and occasional muscle cramping.  He denies fever, weight loss, ear pain, rhinorrhea, chest pain, hemoptysis, dyspnea on exertion, exercise intolerance, cold sensitivity, constipation, muscle weakness, change in sleep patterns, change in medications, and depressed mood.  He has travelled within the past 2 months to the [place] and [place].  He had 2 deer tick exposures in [place] but they were removed in < 48 hrs without a blood meal.  His sinus infection was treated with 10 days of augmentin but he thinks he still had some symptoms at the end of his abx course.  He also states he had a normal endoscopy 2-3 months ago.  Review of Systems     Objective:   Physical Exam Alert and in no distress.  Pharyngeal area is normal without thrush or other lesions. Neck is supple without adenopathy or thyromegaly.  Frontal and maxillary sinus tenderness Cardiac exam shows a regular sinus rhythm without murmurs or gallops.  Lungs are clear to auscultation.     Assessment / Plan:     Acute frontal sinusitis, recurrence not specified - Plan: amoxicillin-clavulanate (AUGMENTIN) 875-125 MG tablet, CBC with Differential/Platelet, Comprehensive metabolic panel  Gastroesophageal reflux disease, esophagitis presence not specified - Plan: omeprazole (PRILOSEC) 40 MG capsule, CBC with Differential/Platelet, Comprehensive metabolic panel  Other fatigue - Plan: CBC with Differential/Platelet, Comprehensive metabolic panel  Will try omeperazole daily to see if this helps with his cough, hoarseness and associated throat symptoms. If the symptoms do diminished, he is to stretch  out the use of the PPIs much as possible and potentially consider going on an H2 blocking agent instead. Headache location is suspicious for unresolved sinus infection and will restart on 10 day course of augmentin.  He will follow up on completion if symptoms have not completely resolved.  Will get a CBC and CMP to evaluate other causes for fatigue.      History and physical exam conducted by Marylen Ponto (Medical Student) in conjunction with Dr. Redmond School.

## 2015-07-29 LAB — CBC WITH DIFFERENTIAL/PLATELET
Basophils Absolute: 0 cells/uL (ref 0–200)
Basophils Relative: 0 %
EOS PCT: 2 %
Eosinophils Absolute: 250 cells/uL (ref 15–500)
HCT: 45.9 % (ref 38.5–50.0)
Hemoglobin: 15.5 g/dL (ref 13.2–17.1)
LYMPHS PCT: 24 %
Lymphs Abs: 3000 cells/uL (ref 850–3900)
MCH: 30.2 pg (ref 27.0–33.0)
MCHC: 33.8 g/dL (ref 32.0–36.0)
MCV: 89.3 fL (ref 80.0–100.0)
MPV: 10.4 fL (ref 7.5–12.5)
Monocytes Absolute: 875 cells/uL (ref 200–950)
Monocytes Relative: 7 %
NEUTROS PCT: 67 %
Neutro Abs: 8375 cells/uL — ABNORMAL HIGH (ref 1500–7800)
PLATELETS: 255 10*3/uL (ref 140–400)
RBC: 5.14 MIL/uL (ref 4.20–5.80)
RDW: 13.1 % (ref 11.0–15.0)
WBC: 12.5 10*3/uL — AB (ref 4.0–10.5)

## 2015-07-29 LAB — COMPREHENSIVE METABOLIC PANEL
ALT: 38 U/L (ref 9–46)
AST: 29 U/L (ref 10–35)
Albumin: 4.8 g/dL (ref 3.6–5.1)
Alkaline Phosphatase: 51 U/L (ref 40–115)
BILIRUBIN TOTAL: 0.5 mg/dL (ref 0.2–1.2)
BUN: 15 mg/dL (ref 7–25)
CO2: 29 mmol/L (ref 20–31)
Calcium: 9.6 mg/dL (ref 8.6–10.3)
Chloride: 98 mmol/L (ref 98–110)
Creat: 1.12 mg/dL (ref 0.70–1.25)
GLUCOSE: 90 mg/dL (ref 65–99)
Potassium: 4.3 mmol/L (ref 3.5–5.3)
SODIUM: 140 mmol/L (ref 135–146)
Total Protein: 7.4 g/dL (ref 6.1–8.1)

## 2015-10-14 DIAGNOSIS — Z85828 Personal history of other malignant neoplasm of skin: Secondary | ICD-10-CM | POA: Diagnosis not present

## 2015-10-14 DIAGNOSIS — Z872 Personal history of diseases of the skin and subcutaneous tissue: Secondary | ICD-10-CM | POA: Diagnosis not present

## 2015-10-14 DIAGNOSIS — L821 Other seborrheic keratosis: Secondary | ICD-10-CM | POA: Diagnosis not present

## 2015-11-27 ENCOUNTER — Ambulatory Visit (INDEPENDENT_AMBULATORY_CARE_PROVIDER_SITE_OTHER): Payer: Medicare Other | Admitting: Family Medicine

## 2015-11-27 ENCOUNTER — Encounter: Payer: Self-pay | Admitting: Family Medicine

## 2015-11-27 VITALS — BP 122/80 | HR 76 | Wt 167.0 lb

## 2015-11-27 DIAGNOSIS — R160 Hepatomegaly, not elsewhere classified: Secondary | ICD-10-CM | POA: Diagnosis not present

## 2015-11-27 DIAGNOSIS — R6882 Decreased libido: Secondary | ICD-10-CM

## 2015-11-27 DIAGNOSIS — R109 Unspecified abdominal pain: Secondary | ICD-10-CM | POA: Diagnosis not present

## 2015-11-27 DIAGNOSIS — R5383 Other fatigue: Secondary | ICD-10-CM | POA: Diagnosis not present

## 2015-11-27 LAB — CBC WITH DIFFERENTIAL/PLATELET
BASOS PCT: 0 %
Basophils Absolute: 0 cells/uL (ref 0–200)
EOS PCT: 2 %
Eosinophils Absolute: 144 cells/uL (ref 15–500)
HCT: 47.9 % (ref 38.5–50.0)
Hemoglobin: 16.4 g/dL (ref 13.2–17.1)
LYMPHS PCT: 39 %
Lymphs Abs: 2808 cells/uL (ref 850–3900)
MCH: 30.5 pg (ref 27.0–33.0)
MCHC: 34.2 g/dL (ref 32.0–36.0)
MCV: 89 fL (ref 80.0–100.0)
MONOS PCT: 12 %
MPV: 10.9 fL (ref 7.5–12.5)
Monocytes Absolute: 864 cells/uL (ref 200–950)
Neutro Abs: 3384 cells/uL (ref 1500–7800)
Neutrophils Relative %: 47 %
PLATELETS: 278 10*3/uL (ref 140–400)
RBC: 5.38 MIL/uL (ref 4.20–5.80)
RDW: 13.2 % (ref 11.0–15.0)
WBC: 7.2 10*3/uL (ref 4.0–10.5)

## 2015-11-27 LAB — POCT URINALYSIS DIPSTICK
Bilirubin, UA: NEGATIVE
GLUCOSE UA: NEGATIVE
Ketones, UA: NEGATIVE
Leukocytes, UA: NEGATIVE
NITRITE UA: NEGATIVE
Protein, UA: NEGATIVE
RBC UA: NEGATIVE
Spec Grav, UA: 1.015
UROBILINOGEN UA: NEGATIVE
pH, UA: 6

## 2015-11-27 LAB — COMPREHENSIVE METABOLIC PANEL
ALK PHOS: 42 U/L (ref 40–115)
ALT: 43 U/L (ref 9–46)
AST: 30 U/L (ref 10–35)
Albumin: 4.7 g/dL (ref 3.6–5.1)
BILIRUBIN TOTAL: 0.6 mg/dL (ref 0.2–1.2)
BUN: 17 mg/dL (ref 7–25)
CO2: 26 mmol/L (ref 20–31)
CREATININE: 1.14 mg/dL (ref 0.70–1.25)
Calcium: 9.7 mg/dL (ref 8.6–10.3)
Chloride: 102 mmol/L (ref 98–110)
GLUCOSE: 99 mg/dL (ref 65–99)
Potassium: 4.3 mmol/L (ref 3.5–5.3)
SODIUM: 138 mmol/L (ref 135–146)
Total Protein: 7.2 g/dL (ref 6.1–8.1)

## 2015-11-27 LAB — TSH: TSH: 2.69 mIU/L (ref 0.40–4.50)

## 2015-11-27 NOTE — Progress Notes (Signed)
   Subjective:    Patient ID: Jon Morales, male    DOB: 1945-08-04, 70 y.o.   MRN: [id number]  HPI He is here for evaluation of left flank pressure sensation this is been going on for the last several weeks. No associated nausea, vomiting, diarrhea, relation to eating, movement or breathing. Usually notes difficulty with night sweats as well as decreased libido, some anhedonia, fatigue. His work and home life seems to be going well.   Review of Systems     Objective:   Physical Exam Alert and in no distress. Full motion of his back. Skin appears normal. No tenderness to palpation. Abdominal exam does show slight hepatomegaly with inspiration however no other masses or tenderness is noted.       Assessment & Plan:  Low libido - Plan: POCT Urinalysis Dipstick, CBC with Differential/Platelet, Comprehensive metabolic panel, Testosterone, TSH  Fatigue, unspecified type - Plan: POCT Urinalysis Dipstick, CBC with Differential/Platelet, Comprehensive metabolic panel, Testosterone, TSH  Left flank pain - Plan: CBC with Differential/Platelet, Comprehensive metabolic panel  Hepatomegaly At this point there is no clear cause of this but will pursue basic blood work and follow-up pending results of that. Apparently the liver was noted to be enlarged in the past.

## 2015-11-28 LAB — TESTOSTERONE: Testosterone: 375 ng/dL (ref 250–827)

## 2015-12-22 DIAGNOSIS — H25813 Combined forms of age-related cataract, bilateral: Secondary | ICD-10-CM | POA: Diagnosis not present

## 2015-12-22 DIAGNOSIS — H40053 Ocular hypertension, bilateral: Secondary | ICD-10-CM | POA: Diagnosis not present

## 2015-12-22 DIAGNOSIS — H524 Presbyopia: Secondary | ICD-10-CM | POA: Diagnosis not present

## 2015-12-22 DIAGNOSIS — H40013 Open angle with borderline findings, low risk, bilateral: Secondary | ICD-10-CM | POA: Diagnosis not present

## 2015-12-22 DIAGNOSIS — H52223 Regular astigmatism, bilateral: Secondary | ICD-10-CM | POA: Diagnosis not present

## 2015-12-22 DIAGNOSIS — H5212 Myopia, left eye: Secondary | ICD-10-CM | POA: Diagnosis not present

## 2016-07-05 ENCOUNTER — Telehealth: Payer: Self-pay | Admitting: Family Medicine

## 2016-07-05 NOTE — Telephone Encounter (Signed)
Left message for pt to call. Pt needs medicare well visit.

## 2016-07-22 ENCOUNTER — Ambulatory Visit (INDEPENDENT_AMBULATORY_CARE_PROVIDER_SITE_OTHER): Payer: Medicare Other | Admitting: Family Medicine

## 2016-07-22 ENCOUNTER — Encounter: Payer: Self-pay | Admitting: Family Medicine

## 2016-07-22 ENCOUNTER — Ambulatory Visit
Admission: RE | Admit: 2016-07-22 | Discharge: 2016-07-22 | Disposition: A | Discharge status: Home / Self Care | Payer: Medicare Other | Source: Ambulatory Visit | Attending: Family Medicine | Admitting: Family Medicine

## 2016-07-22 VITALS — BP 128/82 | HR 79 | Ht 70.0 in | Wt 164.0 lb

## 2016-07-22 DIAGNOSIS — R6882 Decreased libido: Secondary | ICD-10-CM | POA: Diagnosis not present

## 2016-07-22 DIAGNOSIS — R5383 Other fatigue: Secondary | ICD-10-CM

## 2016-07-22 DIAGNOSIS — R05 Cough: Secondary | ICD-10-CM | POA: Diagnosis not present

## 2016-07-22 DIAGNOSIS — R160 Hepatomegaly, not elsewhere classified: Secondary | ICD-10-CM | POA: Diagnosis not present

## 2016-07-22 DIAGNOSIS — R61 Generalized hyperhidrosis: Secondary | ICD-10-CM | POA: Diagnosis not present

## 2016-07-22 LAB — CBC WITH DIFFERENTIAL/PLATELET
BASOS ABS: 0 {cells}/uL (ref 0–200)
Basophils Relative: 0 %
Eosinophils Absolute: 183 cells/uL (ref 15–500)
Eosinophils Relative: 3 %
HCT: 48 % (ref 38.5–50.0)
Hemoglobin: 15.9 g/dL (ref 13.2–17.1)
LYMPHS ABS: 2257 {cells}/uL (ref 850–3900)
Lymphocytes Relative: 37 %
MCH: 29.7 pg (ref 27.0–33.0)
MCHC: 33.1 g/dL (ref 32.0–36.0)
MCV: 89.6 fL (ref 80.0–100.0)
MPV: 10.3 fL (ref 7.5–12.5)
Monocytes Absolute: 610 cells/uL (ref 200–950)
Monocytes Relative: 10 %
Neutro Abs: 3050 cells/uL (ref 1500–7800)
Neutrophils Relative %: 50 %
Platelets: 293 10*3/uL (ref 140–400)
RBC: 5.36 MIL/uL (ref 4.20–5.80)
RDW: 13.2 % (ref 11.0–15.0)
WBC: 6.1 10*3/uL (ref 4.0–10.5)

## 2016-07-22 LAB — TSH: TSH: 2.35 mIU/L (ref 0.40–4.50)

## 2016-07-22 NOTE — Progress Notes (Signed)
   Subjective:    Patient ID: Jon Morales, male    DOB: 1945/07/19, 71 y.o.   MRN: [id number]  HPI he complains of a 6 month history of noticing decreased libido as well as some erectile dysfunction, fatigue and now night sweats and occasional muscle spasms in his legs and a couple of times in his hands. He also complains of a slightly raspy cough. No fever, chills, sore throat, earache, shortness of breath. He does not smoke. He has not noted any weight change.  Review of Systems     Objective:   Physical Exam Alert and in no distress. Tympanic membranes and canals are normal. Pharyngeal area is normal. Neck is supple without adenopathy or thyromegaly. Cardiac exam shows a regular sinus rhythm without murmurs or gallops. Lungs are clear to auscultation. Abdominal exam shows slight hepatomegaly which was also seen prior to this.       Assessment & Plan:  Fatigue, unspecified type - Plan: DG Chest 2 View, CBC with Differential/Platelet, Comprehensive metabolic panel, Testosterone, TSH, Magnesium  Low libido - Plan: Testosterone  Hepatomegaly - Plan: CBC with Differential/Platelet, Comprehensive metabolic panel  Night sweats - Plan: CBC with Differential/Platelet, Comprehensive metabolic panel Difficult to say what exactly is causing all of his symptoms but I think it's reasonable to look for multiple causes.

## 2016-07-23 LAB — MAGNESIUM: Magnesium: 2.2 mg/dL (ref 1.5–2.5)

## 2016-07-23 LAB — COMPREHENSIVE METABOLIC PANEL
ALBUMIN: 4.6 g/dL (ref 3.6–5.1)
ALT: 33 U/L (ref 9–46)
AST: 26 U/L (ref 10–35)
Alkaline Phosphatase: 51 U/L (ref 40–115)
BUN: 17 mg/dL (ref 7–25)
CALCIUM: 9.6 mg/dL (ref 8.6–10.3)
CO2: 19 mmol/L — ABNORMAL LOW (ref 20–31)
CREATININE: 1.15 mg/dL (ref 0.70–1.25)
Chloride: 105 mmol/L (ref 98–110)
Glucose, Bld: 100 mg/dL — ABNORMAL HIGH (ref 65–99)
Potassium: 4.5 mmol/L (ref 3.5–5.3)
SODIUM: 139 mmol/L (ref 135–146)
TOTAL PROTEIN: 7.1 g/dL (ref 6.1–8.1)
Total Bilirubin: 0.5 mg/dL (ref 0.2–1.2)

## 2016-07-23 LAB — TESTOSTERONE: TESTOSTERONE: 463 ng/dL (ref 250–827)

## 2016-07-25 ENCOUNTER — Other Ambulatory Visit: Payer: Self-pay | Admitting: Family Medicine

## 2016-07-25 MED ORDER — LEVOFLOXACIN 500 MG PO TABS: 500.0000 mg | ORAL_TABLET | Freq: Every day | ORAL | 0 refills | Status: DC

## 2016-07-25 NOTE — Progress Notes (Signed)
I discussed the blood work with him and the x-ray. It does show questionable pulmonary findings. I will place him on Levaquin. He is comfortable with that. He will let me know at the end of the antibiotic whether he feels any better.

## 2016-08-05 ENCOUNTER — Emergency Department (HOSPITAL_COMMUNITY): Payer: Medicare Other

## 2016-08-05 ENCOUNTER — Encounter (HOSPITAL_COMMUNITY): Payer: Self-pay | Admitting: Emergency Medicine

## 2016-08-05 ENCOUNTER — Ambulatory Visit: Payer: Medicare Other | Admitting: Family Medicine

## 2016-08-05 ENCOUNTER — Emergency Department (HOSPITAL_COMMUNITY)
Admission: EM | Admit: 2016-08-05 | Discharge: 2016-08-05 | Disposition: A | Discharge status: Home / Self Care | Payer: Medicare Other | Attending: Emergency Medicine | Admitting: Emergency Medicine

## 2016-08-05 ENCOUNTER — Other Ambulatory Visit: Payer: Self-pay

## 2016-08-05 DIAGNOSIS — Z79899 Other long term (current) drug therapy: Secondary | ICD-10-CM | POA: Insufficient documentation

## 2016-08-05 DIAGNOSIS — K219 Gastro-esophageal reflux disease without esophagitis: Secondary | ICD-10-CM | POA: Diagnosis not present

## 2016-08-05 DIAGNOSIS — R079 Chest pain, unspecified: Secondary | ICD-10-CM | POA: Diagnosis not present

## 2016-08-05 DIAGNOSIS — R05 Cough: Secondary | ICD-10-CM | POA: Diagnosis not present

## 2016-08-05 DIAGNOSIS — R0781 Pleurodynia: Secondary | ICD-10-CM | POA: Insufficient documentation

## 2016-08-05 LAB — CBC
HCT: 48.2 % (ref 39.0–52.0)
Hemoglobin: 16.6 g/dL (ref 13.0–17.0)
MCH: 31.1 pg (ref 26.0–34.0)
MCHC: 34.4 g/dL (ref 30.0–36.0)
MCV: 90.3 fL (ref 78.0–100.0)
Platelets: 246 10*3/uL (ref 150–400)
RBC: 5.34 MIL/uL (ref 4.22–5.81)
RDW: 13.2 % (ref 11.5–15.5)
WBC: 12.1 10*3/uL — ABNORMAL HIGH (ref 4.0–10.5)

## 2016-08-05 LAB — D-DIMER, QUANTITATIVE: D-Dimer, Quant: <0.27 ug/mL-FEU (ref 0.00–0.50)

## 2016-08-05 LAB — BASIC METABOLIC PANEL
Anion gap: 10 (ref 5–15)
BUN: 12 mg/dL (ref 6–20)
CALCIUM: 9.3 mg/dL (ref 8.9–10.3)
CO2: 24 mmol/L (ref 22–32)
CREATININE: 1.06 mg/dL (ref 0.61–1.24)
Chloride: 103 mmol/L (ref 101–111)
GFR calc Af Amer: >60 mL/min (ref >60)
GFR calc non Af Amer: >60 mL/min (ref >60)
Glucose, Bld: 96 mg/dL (ref 65–99)
Potassium: 4.1 mmol/L (ref 3.5–5.1)
Sodium: 137 mmol/L (ref 135–145)

## 2016-08-05 LAB — I-STAT TROPONIN, ED
TROPONIN I, POC: 0.01 ng/mL (ref 0.00–0.08)
TROPONIN I, POC: 0 ng/mL (ref 0.00–0.08)

## 2016-08-05 MED ORDER — HYDROCODONE-ACETAMINOPHEN 5-325 MG PO TABS: 1.0000 | ORAL_TABLET | Freq: Four times a day (QID) | ORAL | 0 refills | Status: DC | PRN

## 2016-08-05 MED ORDER — MORPHINE SULFATE (PF) 4 MG/ML IV SOLN
4.0000 mg | Freq: Once | INTRAVENOUS | Status: AC
  Administered 2016-08-05: 4 mg via INTRAVENOUS
  Filled 2016-08-05: qty 1

## 2016-08-05 MED ORDER — IOPAMIDOL (ISOVUE-370) INJECTION 76%
INTRAVENOUS | Status: AC
  Administered 2016-08-05: 100 mL
  Filled 2016-08-05: qty 100

## 2016-08-05 MED ORDER — GI COCKTAIL ~~LOC~~
30.0000 mL | Freq: Once | ORAL | Status: AC
  Administered 2016-08-05: 30 mL via ORAL
  Filled 2016-08-05: qty 30

## 2016-08-05 MED ORDER — NAPROXEN 500 MG PO TABS: 500.0000 mg | ORAL_TABLET | Freq: Two times a day (BID) | ORAL | 0 refills | Status: DC

## 2016-08-05 NOTE — ED Provider Notes (Signed)
[facility] DEPT Provider Note   CSN: [id number] Arrival date & time: 08/05/16  2119     History   Chief Complaint Chief Complaint  Patient presents with  . Chest Pain    HPI Jon Morales is a 71 y.o. male.  Patient is a 71 year old male with no significant past medical history. He presents today for evaluation of chest discomfort. He reports having cocktails yesterday evening with friends, then the symptoms began shortly after returning home. He denies any nausea or diaphoresis, but the pain does radiate into his neck. It is worse with inspiration but he denies any definite shortness of breath. He denies any difficulty swallowing or eating. His pain is constant, however is worsened with inspiration.  He has no prior cardiac history and no cardiac risk factors. He tells me he had a nuclear stress test greater than 10 years ago which was unremarkable.   The history is provided by the patient.  Chest Pain   This is a new problem. The current episode started yesterday. The problem occurs constantly. The problem has been gradually worsening. The pain is associated with breathing. The pain is present in the substernal region. The pain is moderate. The quality of the pain is described as pressure-like. The pain radiates to the right neck. Pertinent negatives include no cough, no diaphoresis, no dizziness, no exertional chest pressure, no lower extremity edema and no shortness of breath. He has tried nothing for the symptoms.    Past Medical History:  Diagnosis Date  . Arthritis   . GERD (gastroesophageal reflux disease)   . Lactose intolerance     Patient Active Problem List   Diagnosis Date Noted  . Hyperlipidemia LDL goal < 130 11/02/2012  . Inguinal hernia unilateral, non-recurrent 10/19/2010    Past Surgical History:  Procedure Laterality Date  . COLONOSCOPY  2010   Dr. Oletta Lamas  . HERNIA REPAIR  10/06/10   left inguinal hernia        Home Medications    Prior to  Admission medications   Medication Sig Start Date End Date Taking? Authorizing Provider  levofloxacin (LEVAQUIN) 500 MG tablet Take 1 tablet (500 mg total) by mouth daily. 07/25/16   Denita Lung, MD  omeprazole (PRILOSEC) 40 MG capsule Take 1 capsule (40 mg total) by mouth daily. 07/28/15   Denita Lung, MD  zolpidem (AMBIEN) 5 MG tablet Take 1 tablet (5 mg total) by mouth at bedtime as needed for sleep. 05/10/13   Denita Lung, MD    Family History Family History  Problem Relation Age of Onset  . Stroke Father     Social History Social History  Substance Use Topics  . Smoking status: Never Smoker  . Smokeless tobacco: Never Used  . Alcohol use 2.4 oz/week    4 Cans of beer per week     Allergies   Sulfa drugs cross reactors   Review of Systems Review of Systems  Constitutional: Negative for diaphoresis.  Respiratory: Negative for cough and shortness of breath.   Cardiovascular: Positive for chest pain.  Neurological: Negative for dizziness.  All other systems reviewed and are negative.    Physical Exam Updated Vital Signs BP (!) 148/97   Pulse (!) 109   Temp 97.9 F (36.6 C) (Oral)   Resp 16   Ht 5\' 10"  (1.778 m)   Wt 74.8 kg (165 lb)   SpO2 100%   BMI 23.68 kg/m   Physical Exam  Constitutional: He is  oriented to person, place, and time. He appears well-developed and well-nourished. No distress.  HENT:  Head: Normocephalic and atraumatic.  Mouth/Throat: Oropharynx is clear and moist.  Neck: Normal range of motion. Neck supple.  Cardiovascular: Normal rate and regular rhythm.  Exam reveals no friction rub.   No murmur heard. Pulmonary/Chest: Effort normal and breath sounds normal. No respiratory distress. He has no wheezes. He has no rales.  Abdominal: Soft. Bowel sounds are normal. He exhibits no distension. There is no tenderness.  Musculoskeletal: Normal range of motion. He exhibits no edema.  There is swelling or tenderness. Homans sign is  absent bilaterally.  Neurological: He is alert and oriented to person, place, and time. Coordination normal.  Skin: Skin is warm and dry. He is not diaphoretic.  Nursing note and vitals reviewed.    ED Treatments / Results  Labs (all labs ordered are listed, but only abnormal results are displayed) Labs Reviewed  BASIC METABOLIC PANEL  CBC  D-DIMER, QUANTITATIVE (NOT AT [facility])  I-STAT TROPOININ, ED    EKG  EKG Interpretation  Date/Time:  Friday August 05 2016 09:16:31 EDT Ventricular Rate:  103 PR Interval:  172 QRS Duration: 66 QT Interval:  318 QTC Calculation: 416 R Axis:   56 Text Interpretation:  Sinus tachycardia Otherwise normal ECG Confirmed by Veryl Speak [phone]) on 08/05/2016 9:54:57 AM       Radiology No results found.  Procedures Procedures (including critical care time)  Medications Ordered in ED Medications  gi cocktail (Maalox,Lidocaine,Donnatal) (not administered)     Initial Impression / Assessment and Plan / ED Course  I have reviewed the triage vital signs and the nursing notes.  Pertinent labs & imaging results that were available during my care of the patient were reviewed by me and considered in my medical decision making (see chart for details).  Patient presents here with complaints of pain in his upper chest that is worse when he breathes and relieved when he rests. His initial EKG is normal and troponin is negative. D-dimer is negative, however due to the nature of the symptoms, I proceeded with a chest CT to rule out PE or other obvious pathology. This was also performed and was negative.  He was observed for several hours in the emergency department and underwent a second troponin which was also negative. He is feeling better after medicine administered in the ED and I believe at this time is appropriate for discharge. I see no evidence for acute pathology and suspect this is either pleurisy or costochondritis. He will be treated with  anti-inflammatories, pain medicine, and follow-up as needed if he worsens or does not improve.  Final Clinical Impressions(s) / ED Diagnoses   Final diagnoses:  None    New Prescriptions New Prescriptions   No medications on file     Veryl Speak, MD 08/05/16 1524

## 2016-08-05 NOTE — Discharge Instructions (Signed)
Naproxen as prescribed. Hydrocodone as prescribed as needed for pain.  Return to the emergency department if you develop worsening pain, difficulty breathing, high fevers, or other new and concerning symptoms.  Follow-up with your primary Dr. if not improving in the next 4-5 days.

## 2016-08-05 NOTE — ED Triage Notes (Signed)
Pt states last night he was drinking with friends and started feeling R sided neck pain, pt just got off round of antibiotics for scarring of lungs, Pt states he feels chest pain and when he breathes in it hurts worse.

## 2016-11-05 IMAGING — US US ABDOMEN COMPLETE
1 series · 14 of 25 positions shown · non-contrast
Comparison: None.

CLINICAL DATA: 68-year-old male with a history of right upper
quadrant pain.

EXAM:
ULTRASOUND ABDOMEN COMPLETE

[Series 1: us abdomen complete · 0.24mm/px · 14 of 67 slices shown]
[im 1/67]
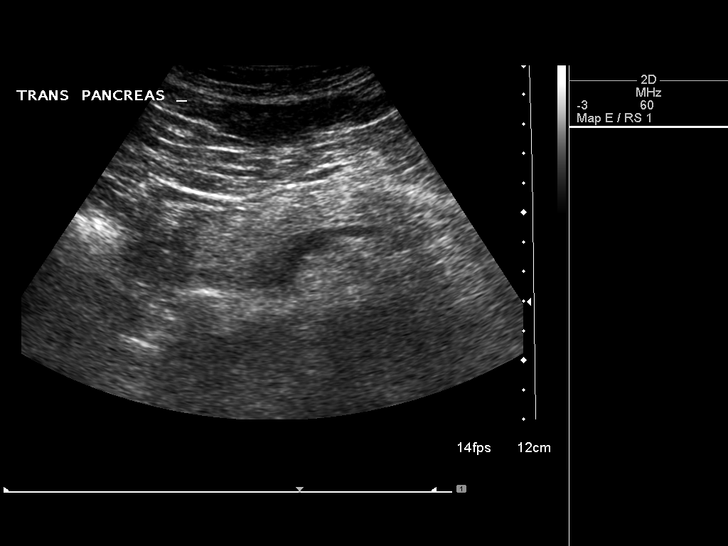
[im 6/67]
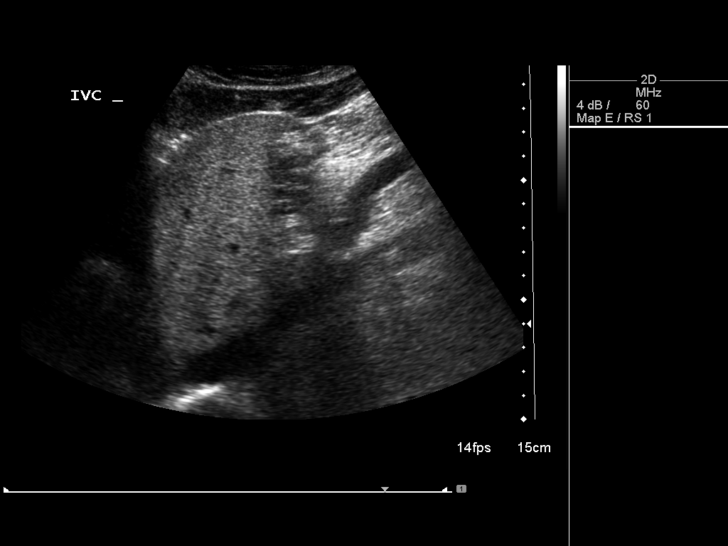
[im 12/67]
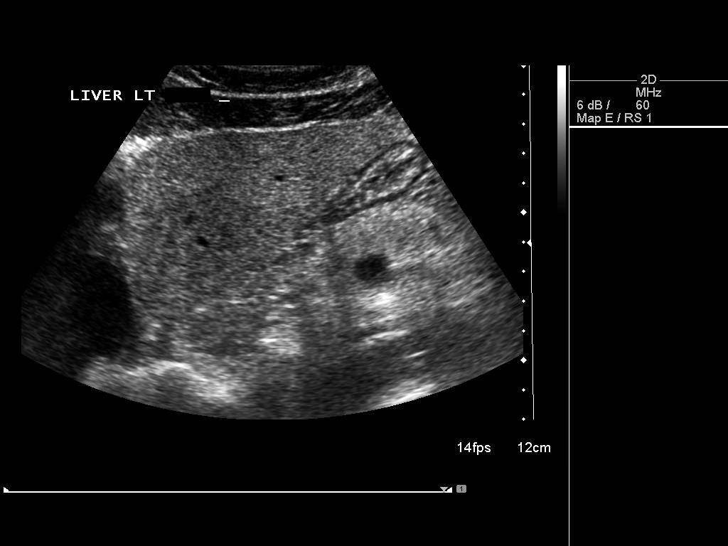
[im 17/67]
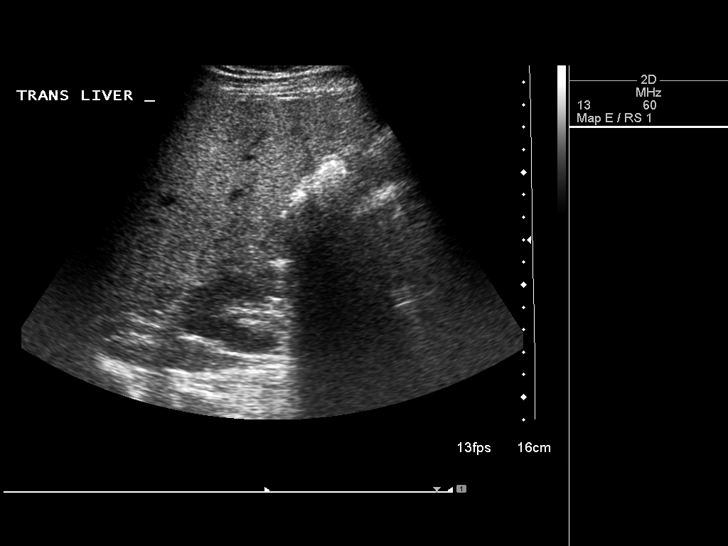
[im 23/67]
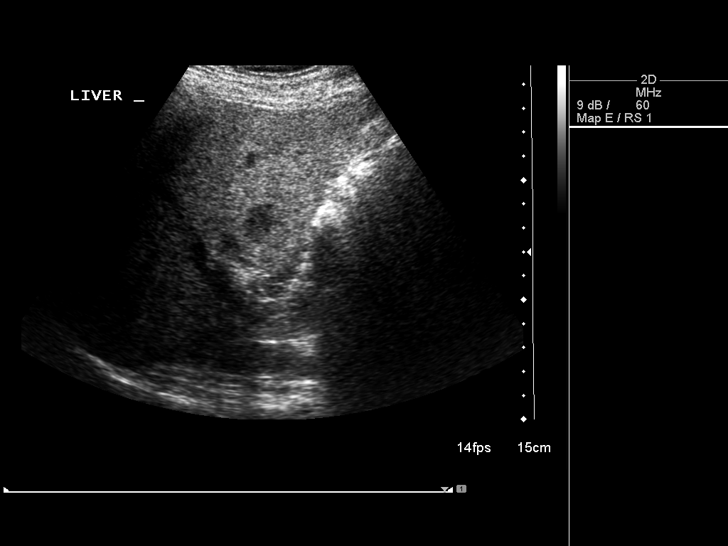
[im 25/67]
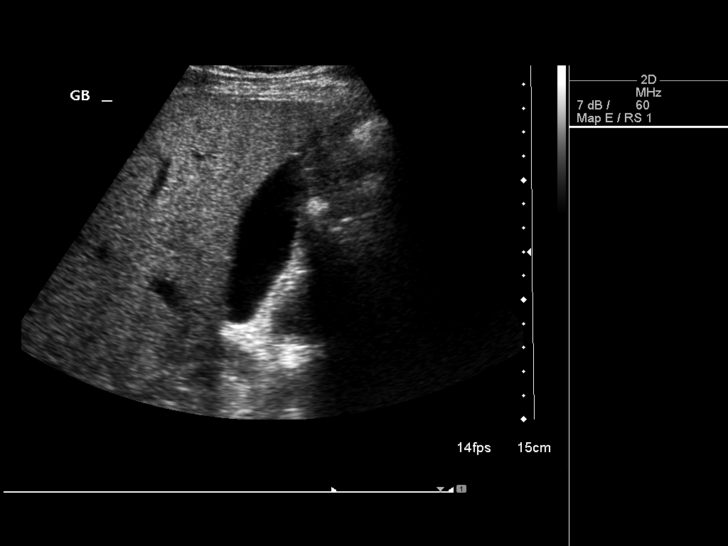
[im 31/67]
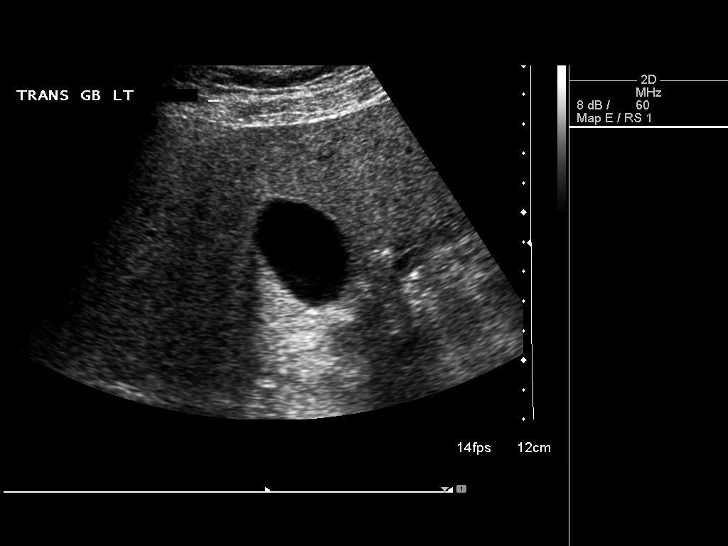
[im 36/67]
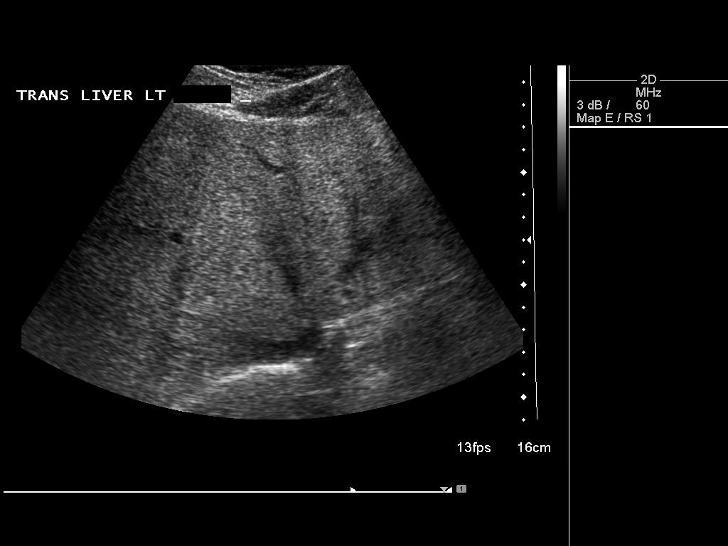
[im 42/67]
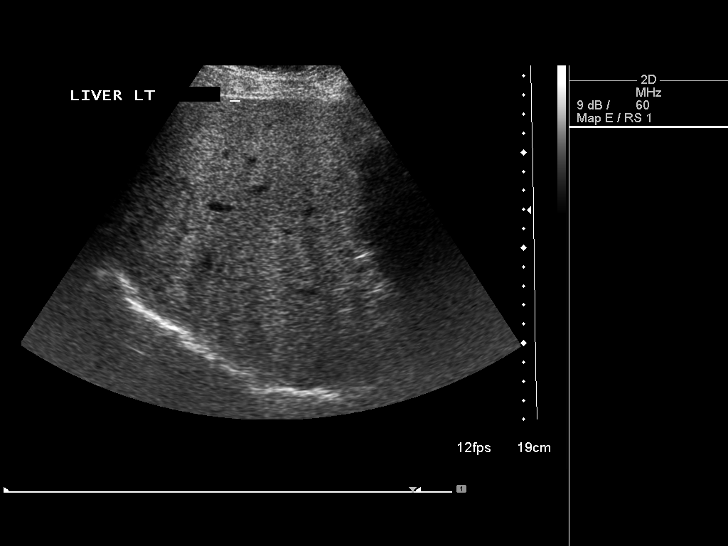
[im 45/67]
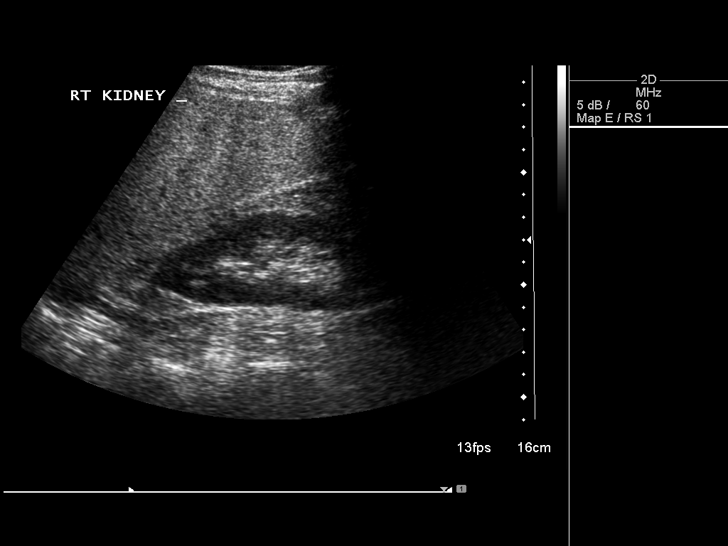
[im 50/67]
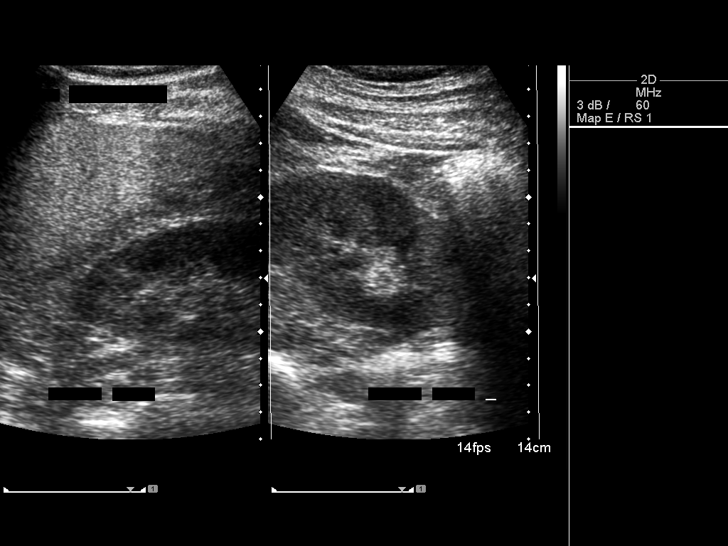
[im 56/67]
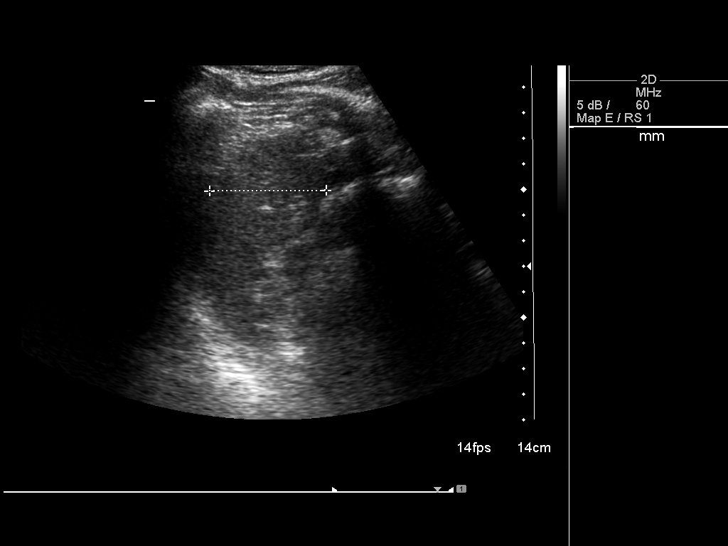
[im 61/67]
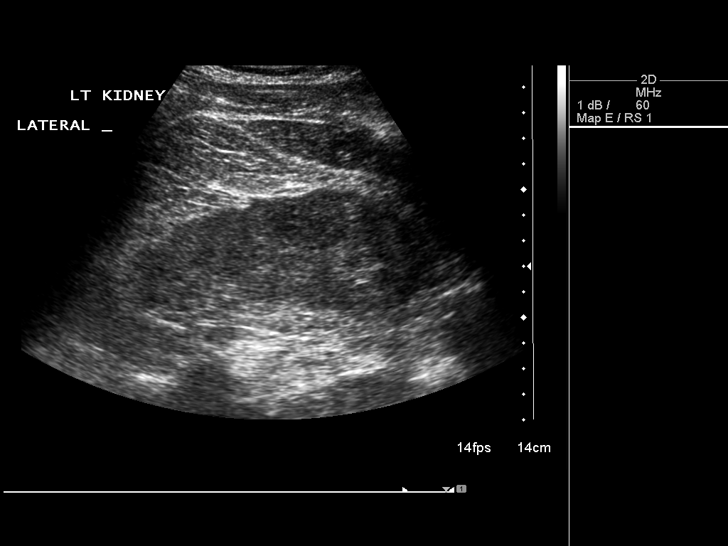
[im 67/67]
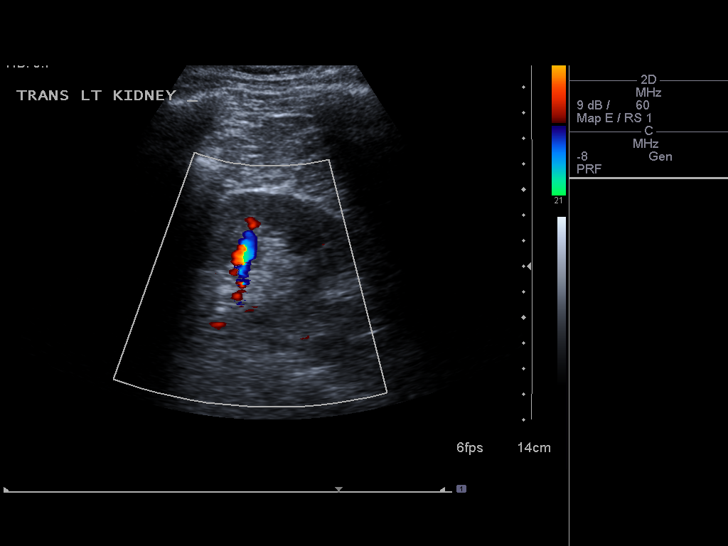

[14 of 25 positions shown; findings below may reference images not displayed]

FINDINGS: Gallbladder: No gallstones or wall thickening visualized. No
sonographic Murphy sign noted.

Common bile duct: Diameter: 3.1 mm

Liver: Heterogeneous echotexture of the liver. Focal fatty sparing
adjacent to the gallbladder fossa.

IVC: No abnormality visualized.

Pancreas: Visualized portion unremarkable.

Spleen: Size and appearance within normal limits.

Right Kidney: Length: 11.0 cm. Echogenicity within normal limits. No
mass or hydronephrosis visualized.

Left Kidney: Length: 11.6 cm. Echogenicity within normal limits. No
mass or hydronephrosis visualized.

Abdominal aorta: No aneurysm visualized.

Other findings: None.
IMPRESSION: No sonographic evidence of acute cholecystitis.

Steatosis.

## 2017-01-13 DIAGNOSIS — H40013 Open angle with borderline findings, low risk, bilateral: Secondary | ICD-10-CM | POA: Diagnosis not present

## 2017-01-27 DIAGNOSIS — H401131 Primary open-angle glaucoma, bilateral, mild stage: Secondary | ICD-10-CM | POA: Diagnosis not present

## 2017-03-24 ENCOUNTER — Telehealth: Payer: Self-pay

## 2017-03-24 ENCOUNTER — Ambulatory Visit (INDEPENDENT_AMBULATORY_CARE_PROVIDER_SITE_OTHER): Payer: Medicare Other | Admitting: Family Medicine

## 2017-03-24 ENCOUNTER — Encounter: Payer: Self-pay | Admitting: Family Medicine

## 2017-03-24 VITALS — BP 124/84 | HR 73 | Ht 69.0 in | Wt 166.6 lb

## 2017-03-24 DIAGNOSIS — H269 Unspecified cataract: Secondary | ICD-10-CM

## 2017-03-24 DIAGNOSIS — Z79899 Other long term (current) drug therapy: Secondary | ICD-10-CM | POA: Diagnosis not present

## 2017-03-24 DIAGNOSIS — I700 Atherosclerosis of aorta: Secondary | ICD-10-CM

## 2017-03-24 DIAGNOSIS — Z13220 Encounter for screening for lipoid disorders: Secondary | ICD-10-CM | POA: Diagnosis not present

## 2017-03-24 DIAGNOSIS — Z125 Encounter for screening for malignant neoplasm of prostate: Secondary | ICD-10-CM

## 2017-03-24 DIAGNOSIS — Z1159 Encounter for screening for other viral diseases: Secondary | ICD-10-CM

## 2017-03-24 LAB — POCT URINALYSIS DIP (PROADVANTAGE DEVICE)
BILIRUBIN UA: NEGATIVE
Glucose, UA: NEGATIVE mg/dL
Ketones, POC UA: NEGATIVE mg/dL
Leukocytes, UA: NEGATIVE
Nitrite, UA: NEGATIVE
PH UA: 6.5 (ref 5.0–8.0)
Protein Ur, POC: NEGATIVE mg/dL
RBC UA: NEGATIVE
SPECIFIC GRAVITY, URINE: 1.02
UUROB: 3.5

## 2017-03-24 NOTE — Telephone Encounter (Signed)
Pt was called to inform him of his eye appointment which is march 12,2019 at 9 am . Pt was given [place] phone number and address as well. Thanks [employer]

## 2017-03-24 NOTE — Progress Notes (Signed)
Jon Morales is a 72 y.o. male who presents for annual wellness visit and follow-up on chronic medical conditions.  He has the following concerns: He has had occasional difficulty with occipital headaches with plans to be handling this through chiropractic as well as possible acupuncture.  He was seen in the emergency room this year for evaluation of chest discomfort which showed no major issues.  The CT scan did show evidence of atherosclerosis.  He also sees an ophthalmologist and does have cataracts.  He does not smoke and rarely drinks.  His work is going well.  Review of his record indicates no immunizations and he philosophically has no desire to get his immunizations updated.   Immunizations and Health Maintenance  There is no immunization history on file for this patient. Health Maintenance Due  Topic Date Due  . Hepatitis C Screening  1945/10/27  . PNA vac Low Risk Adult (1 of 2 - PCV13) 08/20/2012  . INFLUENZA VACCINE  09/14/2016    Last colonoscopy: two or three years ago Last PSA: over one year Dentist:six months  Ophtho: last month Exercise: two to three times a week for 45 min  Other doctors caring for patient include:  Advanced Directives: Does Patient Have a Medical Advance Directive?: No Would patient like information on creating a medical advance directive?: No - Patient declined  Depression screen:  See questionnaire below.     Depression screen [facility] 2/9 03/24/2017 11/02/2012  Decreased Interest - 0  Down, Depressed, Hopeless 0 0  PHQ - 2 Score 0 0    Fall Screen: See Questionaire below.   Fall Risk  03/24/2017 11/02/2012  Falls in the past year? No No    ADL screen:  See questionnaire below.  Functional Status Survey: Is the patient deaf or have difficulty hearing?: No Does the patient have difficulty seeing, even when wearing glasses/contacts?: No Does the patient have difficulty concentrating, remembering, or making decisions?: No Does the patient have  difficulty walking or climbing stairs?: No Does the patient have difficulty dressing or bathing?: No Does the patient have difficulty doing errands alone such as visiting a doctor's office or shopping?: No   Review of Systems  Constitutional: -, -unexpected weight change, -anorexia, -fatigue Allergy: -sneezing, -itching, -congestion Dermatology: denies changing moles, rash, lumps ENT: -runny nose, -ear pain, -sore throat,  Cardiology:  -chest pain, -palpitations, -orthopnea, Respiratory: -cough, -shortness of breath, -dyspnea on exertion, -wheezing,  Gastroenterology: -abdominal pain, -nausea, -vomiting, -diarrhea, -constipation, -dysphagia Hematology: -bleeding or bruising problems Musculoskeletal: -arthralgias, -myalgias, -joint swelling, -back pain, - Ophthalmology: -vision changes,  Urology: -dysuria, -difficulty urinating,  -urinary frequency, -urgency, incontinence Neurology: -, -numbness, , -memory loss, -falls, -dizziness    PHYSICAL EXAM:  BP 124/84 (BP Location: Left Arm, Patient Position: Sitting)   Pulse 73   Ht 5\' 9"  (1.753 m)   Wt 166 lb 9.6 oz (75.6 kg)   SpO2 97%   BMI 24.60 kg/m   General Appearance: Alert, cooperative, no distress, appears stated age Head: Normocephalic, without obvious abnormality, atraumatic Eyes: PERRL, conjunctiva/corneas clear, EOM's intact, fundi benign Ears: Normal TM's and external ear canals Nose: Nares normal, mucosa normal, no drainage or sinus   tenderness Throat: Lips, mucosa, and tongue normal; teeth and gums normal Neck: Supple, no lymphadenopathy, thyroid:no enlargement/tenderness/nodules; no carotid bruit or JVD Lungs: Clear to auscultation bilaterally without wheezes, rales or ronchi; respirations unlabored Heart: Regular rate and rhythm, S1 and S2 normal, no murmur, rub or gallop Abdomen: Soft, non-tender, nondistended, normoactive  bowel sounds, no masses, no hepatosplenomegaly Extremities: No clubbing, cyanosis or  edema Pulses: 2+ and symmetric all extremities Skin: Skin color, texture, turgor normal, no rashes or lesions Lymph nodes: Cervical, supraclavicular, and axillary nodes normal Neurologic: CNII-XII intact, normal strength, sensation and gait; reflexes 2+ and symmetric throughout   Psych: Normal mood, affect, hygiene and grooming  ASSESSMENT/PLAN: Encounter for long-term (current) use of medications - Plan: POCT Urinalysis DIP (Proadvantage Device)  Thoracic aortic atherosclerosis ([place]) - Plan: Lipid panel  Screening for prostate cancer - Plan: PSA, Medicare ([place]), PSA  Need for hepatitis C screening test - Plan: Hepatitis C antibody  Screening for lipid disorders - Plan: Lipid panel  Cataract of both eyes, unspecified cataract type - Plan: Ambulatory referral to Ophthalmology      Medicare Attestation I have personally reviewed: The patient's medical and social history Their use of alcohol, tobacco or illicit drugs Their current medications and supplements The patient's functional ability including ADLs,fall risks, home safety risks, cognitive, and hearing and visual impairment Diet and physical activities Evidence for depression or mood disorders  The patient's weight, height, and BMI have been recorded in the chart.  I have made referrals, counseling, and provided education to the patient based on review of the above and I have provided the patient with a written personalized care plan for preventive services.     Jill Alexanders, MD   03/24/2017

## 2017-03-25 LAB — LIPID PANEL
CHOLESTEROL TOTAL: 229 mg/dL — AB (ref 100–199)
Chol/HDL Ratio: 3.5 ratio (ref 0.0–5.0)
HDL: 66 mg/dL (ref >39)
LDL CALC: 139 mg/dL — AB (ref 0–99)
TRIGLYCERIDES: 118 mg/dL (ref 0–149)
VLDL CHOLESTEROL CAL: 24 mg/dL (ref 5–40)

## 2017-03-25 LAB — HEPATITIS C ANTIBODY: Hep C Virus Ab: <0.1 s/co ratio (ref 0.0–0.9)

## 2017-03-25 LAB — PSA: Prostate Specific Ag, Serum: 1.1 ng/mL (ref 0.0–4.0)

## 2017-05-09 DIAGNOSIS — H2513 Age-related nuclear cataract, bilateral: Secondary | ICD-10-CM | POA: Diagnosis not present

## 2017-05-09 DIAGNOSIS — H401132 Primary open-angle glaucoma, bilateral, moderate stage: Secondary | ICD-10-CM | POA: Diagnosis not present

## 2017-06-20 DIAGNOSIS — H2513 Age-related nuclear cataract, bilateral: Secondary | ICD-10-CM | POA: Diagnosis not present

## 2017-06-20 DIAGNOSIS — H401132 Primary open-angle glaucoma, bilateral, moderate stage: Secondary | ICD-10-CM | POA: Diagnosis not present

## 2017-07-31 ENCOUNTER — Ambulatory Visit (INDEPENDENT_AMBULATORY_CARE_PROVIDER_SITE_OTHER): Payer: Medicare Other | Admitting: Family Medicine

## 2017-07-31 ENCOUNTER — Encounter: Payer: Self-pay | Admitting: Family Medicine

## 2017-07-31 VITALS — BP 144/80 | HR 90 | Temp 97.7°F | Wt 171.0 lb

## 2017-07-31 DIAGNOSIS — R195 Other fecal abnormalities: Secondary | ICD-10-CM

## 2017-07-31 NOTE — Progress Notes (Signed)
   Subjective:    Patient ID: Jon Morales, male    DOB: 31-Mar-1945, 72 y.o.   MRN: [id number]  HPI He is here for consultation concerning loose bowel habits.  This is been going on since the middle of May.  Towards the end of May he did go to [place] and thinks that his symptoms might of gotten worse.  He does complain of occasional night sweats as well as a dull headache but no fever, chills, malaise or fatigue.  He is on probiotics and is also using other OTC meds including oil of oregano which can apparently help with this.  He has no nausea, vomiting, fever, chills.  He does state that his stool does have a foul smell to it.  Review of Systems     Objective:   Physical Exam Alert and in no distress.  Cardiac and lung exam normal.  Abdominal exam shows diminished bowel sounds with no masses or tenderness       Assessment & Plan:  Loose stools I discussed options with him concerning doing stool for ova and parasites etc. versus watchful waiting.  Recommend continuing with probiotics and if symptoms continue for another couple weeks, call and we will pursue this further.  He was comfortable with this.

## 2017-09-29 ENCOUNTER — Encounter: Payer: Self-pay | Admitting: Family Medicine

## 2017-10-04 DIAGNOSIS — K760 Fatty (change of) liver, not elsewhere classified: Secondary | ICD-10-CM | POA: Diagnosis not present

## 2017-10-04 DIAGNOSIS — R197 Diarrhea, unspecified: Secondary | ICD-10-CM | POA: Diagnosis not present

## 2017-10-04 DIAGNOSIS — R1011 Right upper quadrant pain: Secondary | ICD-10-CM | POA: Diagnosis not present

## 2017-10-04 DIAGNOSIS — K649 Unspecified hemorrhoids: Secondary | ICD-10-CM | POA: Diagnosis not present

## 2017-10-04 DIAGNOSIS — L290 Pruritus ani: Secondary | ICD-10-CM | POA: Diagnosis not present

## 2017-10-06 ENCOUNTER — Telehealth: Payer: Self-pay

## 2017-10-06 ENCOUNTER — Other Ambulatory Visit: Payer: Self-pay

## 2017-10-06 DIAGNOSIS — R197 Diarrhea, unspecified: Secondary | ICD-10-CM | POA: Diagnosis not present

## 2017-10-06 DIAGNOSIS — R195 Other fecal abnormalities: Secondary | ICD-10-CM

## 2017-10-06 NOTE — Telephone Encounter (Signed)
Called pt to advise he needs tpo come by and pick up a stool sample kit form the lab. No Answer LVM. kh

## 2017-10-13 DIAGNOSIS — R109 Unspecified abdominal pain: Secondary | ICD-10-CM | POA: Diagnosis not present

## 2017-10-13 DIAGNOSIS — R197 Diarrhea, unspecified: Secondary | ICD-10-CM | POA: Diagnosis not present

## 2017-12-13 ENCOUNTER — Telehealth: Payer: Self-pay

## 2017-12-13 NOTE — Telephone Encounter (Signed)
Called pt to advise of screening needing to be completed. [place]

## 2017-12-14 ENCOUNTER — Telehealth: Payer: Self-pay

## 2017-12-14 NOTE — Telephone Encounter (Signed)
Pt would ike to know about kidney size due to U/S he has had in past. Dr. Redmond School is speaking to pt

## 2017-12-15 DIAGNOSIS — H401132 Primary open-angle glaucoma, bilateral, moderate stage: Secondary | ICD-10-CM | POA: Diagnosis not present

## 2017-12-15 DIAGNOSIS — H2513 Age-related nuclear cataract, bilateral: Secondary | ICD-10-CM | POA: Diagnosis not present

## 2017-12-26 DIAGNOSIS — J019 Acute sinusitis, unspecified: Secondary | ICD-10-CM | POA: Diagnosis not present

## 2017-12-26 DIAGNOSIS — B9689 Other specified bacterial agents as the cause of diseases classified elsewhere: Secondary | ICD-10-CM | POA: Diagnosis not present

## 2018-04-20 DIAGNOSIS — H2513 Age-related nuclear cataract, bilateral: Secondary | ICD-10-CM | POA: Diagnosis not present

## 2018-04-20 DIAGNOSIS — H401131 Primary open-angle glaucoma, bilateral, mild stage: Secondary | ICD-10-CM | POA: Diagnosis not present

## 2018-08-14 DIAGNOSIS — B351 Tinea unguium: Secondary | ICD-10-CM | POA: Diagnosis not present

## 2018-08-14 DIAGNOSIS — L821 Other seborrheic keratosis: Secondary | ICD-10-CM | POA: Diagnosis not present

## 2018-08-14 DIAGNOSIS — L570 Actinic keratosis: Secondary | ICD-10-CM | POA: Diagnosis not present

## 2018-08-14 DIAGNOSIS — D229 Melanocytic nevi, unspecified: Secondary | ICD-10-CM | POA: Diagnosis not present

## 2018-08-24 DIAGNOSIS — H401131 Primary open-angle glaucoma, bilateral, mild stage: Secondary | ICD-10-CM | POA: Diagnosis not present

## 2018-08-24 DIAGNOSIS — H2513 Age-related nuclear cataract, bilateral: Secondary | ICD-10-CM | POA: Diagnosis not present

## 2018-11-12 DIAGNOSIS — H401131 Primary open-angle glaucoma, bilateral, mild stage: Secondary | ICD-10-CM | POA: Diagnosis not present

## 2018-11-12 DIAGNOSIS — H2513 Age-related nuclear cataract, bilateral: Secondary | ICD-10-CM | POA: Diagnosis not present

## 2018-11-12 DIAGNOSIS — H0011 Chalazion right upper eyelid: Secondary | ICD-10-CM | POA: Diagnosis not present

## 2019-01-18 DIAGNOSIS — H401131 Primary open-angle glaucoma, bilateral, mild stage: Secondary | ICD-10-CM | POA: Diagnosis not present

## 2019-01-18 DIAGNOSIS — H2513 Age-related nuclear cataract, bilateral: Secondary | ICD-10-CM | POA: Diagnosis not present

## 2019-01-25 DIAGNOSIS — Z20828 Contact with and (suspected) exposure to other viral communicable diseases: Secondary | ICD-10-CM | POA: Diagnosis not present

## 2019-05-30 ENCOUNTER — Other Ambulatory Visit: Payer: Self-pay

## 2019-05-30 ENCOUNTER — Encounter: Payer: Self-pay | Admitting: Dermatology

## 2019-05-30 ENCOUNTER — Ambulatory Visit (INDEPENDENT_AMBULATORY_CARE_PROVIDER_SITE_OTHER): Payer: Medicare Other | Admitting: Dermatology

## 2019-05-30 DIAGNOSIS — D229 Melanocytic nevi, unspecified: Secondary | ICD-10-CM

## 2019-05-30 DIAGNOSIS — L821 Other seborrheic keratosis: Secondary | ICD-10-CM | POA: Diagnosis not present

## 2019-05-30 DIAGNOSIS — L299 Pruritus, unspecified: Secondary | ICD-10-CM | POA: Diagnosis not present

## 2019-05-30 DIAGNOSIS — D2262 Melanocytic nevi of left upper limb, including shoulder: Secondary | ICD-10-CM

## 2019-05-31 DIAGNOSIS — H2513 Age-related nuclear cataract, bilateral: Secondary | ICD-10-CM | POA: Diagnosis not present

## 2019-05-31 DIAGNOSIS — H401131 Primary open-angle glaucoma, bilateral, mild stage: Secondary | ICD-10-CM | POA: Diagnosis not present

## 2019-06-03 ENCOUNTER — Encounter: Payer: Self-pay | Admitting: Dermatology

## 2019-06-03 NOTE — Progress Notes (Signed)
   Follow-Up Visit   Subjective  Jon Morales is a 74 y.o. male who presents for the following: Nevus (Place on left posterior shoulder.  States that it itches at times. No bleeding.) and Rash (Mainly on his left side. Couple of months.  Can feel the bumps under his skin.  Itching with stinging at times.  Thought it was shingles.  No treatment tried.).  growth Location: Left shoulder Duration: Past year Quality: Perhaps bigger Associated Signs/Symptoms: Modifying Factors:  Severity:  Timing: Context: Also has an itchy rash on left back  The following portions of the chart were reviewed this encounter and updated as appropriate:     Objective  Well appearing patient in no apparent distress; mood and affect are within normal limits.  All skin waist up examined.   Assessment & Plan  No sign skin cancer. Self-examine twice annually, dermatologist once yearly.

## 2019-06-28 DIAGNOSIS — K625 Hemorrhage of anus and rectum: Secondary | ICD-10-CM | POA: Diagnosis not present

## 2019-06-28 DIAGNOSIS — R5383 Other fatigue: Secondary | ICD-10-CM | POA: Diagnosis not present

## 2019-10-02 DIAGNOSIS — Z20828 Contact with and (suspected) exposure to other viral communicable diseases: Secondary | ICD-10-CM | POA: Diagnosis not present

## 2019-10-08 ENCOUNTER — Encounter: Payer: Self-pay | Admitting: Family Medicine

## 2019-10-08 ENCOUNTER — Other Ambulatory Visit (HOSPITAL_COMMUNITY): Payer: Self-pay | Admitting: Hospice and Palliative Medicine

## 2019-10-08 ENCOUNTER — Telehealth (INDEPENDENT_AMBULATORY_CARE_PROVIDER_SITE_OTHER): Payer: Medicare Other | Admitting: Family Medicine

## 2019-10-08 ENCOUNTER — Other Ambulatory Visit: Payer: Self-pay

## 2019-10-08 ENCOUNTER — Telehealth: Payer: Self-pay | Admitting: Internal Medicine

## 2019-10-08 ENCOUNTER — Encounter: Payer: Self-pay | Admitting: Hospice and Palliative Medicine

## 2019-10-08 VITALS — Temp 100.0°F | Ht 70.0 in | Wt 165.0 lb

## 2019-10-08 DIAGNOSIS — U071 COVID-19: Secondary | ICD-10-CM | POA: Diagnosis not present

## 2019-10-08 NOTE — Telephone Encounter (Signed)
Sent message for the infusion clinic. And notified pt of call and order from home monitoring

## 2019-10-08 NOTE — Progress Notes (Signed)
I connected by phone with  Mr. Buttery on 10/08/2019 to discuss the potential use of an new treatment for mild to moderate COVID-19 viral infection in non-hospitalized patients.   This patient is a age/sex that meets the FDA criteria for Emergency Use Authorization of casirivimab\imdevimab.  Has a (+) direct SARS-CoV-2 viral test result 1. Has mild or moderate COVID-19  2. Is ? 72 years of age and weighs ? 40 kg 3. Is NOT hospitalized due to COVID-19 4. Is NOT requiring oxygen therapy or requiring an increase in baseline oxygen flow rate due to COVID-19 5. Is within 10 days of symptom onset 6. Has at least one of the high risk factor(s) for progression to severe COVID-19 and/or hospitalization as defined in EUA. ? Specific high risk criteria :age   Symptom onset: 8/17, positive on 8/19   I have spoken and communicated the following to the patient or parent/caregiver:   1. FDA has authorized the emergency use of casirivimab\imdevimab for the treatment of mild to moderate COVID-19 in adults and pediatric patients with positive results of direct SARS-CoV-2 viral testing who are 65 years of age and older weighing at least 40 kg, and who are at high risk for progressing to severe COVID-19 and/or hospitalization.   2. The significant known and potential risks and benefits of casirivimab\imdevimab, and the extent to which such potential risks and benefits are unknown.   3. Information on available alternative treatments and the risks and benefits of those alternatives, including clinical trials.   4. Patients treated with casirivimab\imdevimab should continue to self-isolate and use infection control measures (e.g., wear mask, isolate, social distance, avoid sharing personal items, clean and disinfect high touch surfaces, and frequent handwashing) according to CDC guidelines.    5. The patient or parent/caregiver has the option to accept or refuse casirivimab\imdevimab .   After reviewing this  information with the patient, The patient agreed to proceed with receiving casirivimab\imdevimab infusion and will be provided a copy of the Fact sheet prior to receiving the infusion.Altha Harm, PhD, NP-C [phone] ([place])

## 2019-10-08 NOTE — Progress Notes (Signed)
   Subjective:    Patient ID: Jon Morales, male    DOB: 1945/09/09, 74 y.o.   MRN: [id number]  HPI I connected with  Jon Morales on 10/08/19 by a video enabled telemedicine application and verified that I am speaking with the correct person using two identifiers.  [place] was attempted but he could not get audio.  He was at home and I am in my office I discussed the limitations of evaluation and management by telemedicine. The patient expressed understanding and agreed to proceed. He developed cough, fatigue and fever on August 16.  He continued to have difficulty with that but several days after that he lost his sense of taste and smell.  He is also had some myalgias and arthralgias as well as headache with some abdominal pain and diarrhea.  He was tested on August 18 but did not get the result back until August 22 and was positive.  He has been checking his pulse ox and it runs in the 95 range and his most recent temperature was 100. He did not get the vaccination.  Review of Systems     Objective:   Physical Exam Alert and visually in no distress.       Assessment & Plan:  COVID-19 I will set him up to get the IV antibody infusion.  Cautioned to watch his pulse ox and keep it above 92, treat the fever aches and pains with Tylenol.  If he becomes more short of breath, coughing, fever and fatigue, he is to call.  He was comfortable with that. Discussed getting the vaccine at least a month after this bout.

## 2019-10-09 ENCOUNTER — Ambulatory Visit (HOSPITAL_COMMUNITY)
Admission: RE | Admit: 2019-10-09 | Discharge: 2019-10-09 | Disposition: A | Discharge status: Home / Self Care | Payer: Medicare Other | Source: Ambulatory Visit | Attending: Pulmonary Disease | Admitting: Pulmonary Disease

## 2019-10-09 DIAGNOSIS — U071 COVID-19: Secondary | ICD-10-CM | POA: Insufficient documentation

## 2019-10-09 DIAGNOSIS — Z23 Encounter for immunization: Secondary | ICD-10-CM | POA: Insufficient documentation

## 2019-10-09 MED ORDER — ALBUTEROL SULFATE HFA 108 (90 BASE) MCG/ACT IN AERS: 2.0000 | INHALATION_SPRAY | Freq: Once | RESPIRATORY_TRACT | Status: DC | PRN

## 2019-10-09 MED ORDER — METHYLPREDNISOLONE SODIUM SUCC 125 MG IJ SOLR: 125.0000 mg | Freq: Once | INTRAMUSCULAR | Status: DC | PRN

## 2019-10-09 MED ORDER — FAMOTIDINE IN NACL 20-0.9 MG/50ML-% IV SOLN: 20.0000 mg | Freq: Once | INTRAVENOUS | Status: DC | PRN

## 2019-10-09 MED ORDER — SODIUM CHLORIDE 0.9 % IV SOLN: INTRAVENOUS | Status: DC | PRN

## 2019-10-09 MED ORDER — DIPHENHYDRAMINE HCL 50 MG/ML IJ SOLN: 50.0000 mg | Freq: Once | INTRAMUSCULAR | Status: DC | PRN

## 2019-10-09 MED ORDER — EPINEPHRINE 0.3 MG/0.3ML IJ SOAJ: 0.3000 mg | Freq: Once | INTRAMUSCULAR | Status: DC | PRN

## 2019-10-09 MED ORDER — SODIUM CHLORIDE 0.9 % IV SOLN
1200.0000 mg | Freq: Once | INTRAVENOUS | Status: AC
  Administered 2019-10-09: 09:00:00 1200 mg via INTRAVENOUS
  Filled 2019-10-09: qty 10

## 2019-10-09 NOTE — Discharge Instructions (Signed)

## 2019-10-09 NOTE — Progress Notes (Signed)
  Diagnosis: COVID-19  Physician: Dr Joline Maxcy  Procedure: Covid Infusion Clinic Med: casirivimab\imdevimab infusion - Provided patient with casirivimab\imdevimab fact sheet for patients, parents and caregivers prior to infusion.  Complications: No immediate complications noted.  Discharge: Discharged home   Jon Morales 10/09/2019

## 2019-11-08 DIAGNOSIS — H401131 Primary open-angle glaucoma, bilateral, mild stage: Secondary | ICD-10-CM | POA: Diagnosis not present

## 2019-11-08 DIAGNOSIS — H35343 Macular cyst, hole, or pseudohole, bilateral: Secondary | ICD-10-CM | POA: Diagnosis not present

## 2019-11-08 DIAGNOSIS — H2513 Age-related nuclear cataract, bilateral: Secondary | ICD-10-CM | POA: Diagnosis not present

## 2019-12-05 DIAGNOSIS — H401121 Primary open-angle glaucoma, left eye, mild stage: Secondary | ICD-10-CM | POA: Diagnosis not present

## 2019-12-05 DIAGNOSIS — H2512 Age-related nuclear cataract, left eye: Secondary | ICD-10-CM | POA: Diagnosis not present

## 2020-01-14 ENCOUNTER — Ambulatory Visit (INDEPENDENT_AMBULATORY_CARE_PROVIDER_SITE_OTHER): Payer: Medicare Other | Admitting: Family Medicine

## 2020-01-14 ENCOUNTER — Other Ambulatory Visit: Payer: Self-pay

## 2020-01-14 ENCOUNTER — Encounter: Payer: Self-pay | Admitting: Family Medicine

## 2020-01-14 VITALS — BP 150/94 | HR 88 | Temp 97.5°F | Wt 165.8 lb

## 2020-01-14 DIAGNOSIS — U099 Post covid-19 condition, unspecified: Secondary | ICD-10-CM

## 2020-01-14 DIAGNOSIS — G9332 Myalgic encephalomyelitis/chronic fatigue syndrome: Secondary | ICD-10-CM

## 2020-01-14 DIAGNOSIS — R5382 Chronic fatigue, unspecified: Secondary | ICD-10-CM

## 2020-01-14 DIAGNOSIS — K649 Unspecified hemorrhoids: Secondary | ICD-10-CM

## 2020-01-14 LAB — CBC WITH DIFFERENTIAL/PLATELET
Basos: 1 %
Eos: 2 %
Immature Granulocytes: 0 %
Lymphocytes Absolute: 2.5 10*3/uL (ref 0.7–3.1)
Lymphs: 34 %
MCHC: 34.1 g/dL (ref 31.5–35.7)
RDW: 12.1 % (ref 11.6–15.4)

## 2020-01-14 LAB — COMPREHENSIVE METABOLIC PANEL

## 2020-01-14 NOTE — Progress Notes (Signed)
° °  Subjective:    Patient ID: Jon Morales, male    DOB: 04-11-1945, 74 y.o.   MRN: [id number]  HPI He is here for recheck on Covid.  He continues to complain of fatigue, intermittent left-sided low back pain that he has had in the past.  He also complains of some right great toe pain.  He also complains of difficulty with hemorrhoids and also describes painful BMs.   Review of Systems     Objective:   Physical Exam Alert and in no distress.  Cardiac exam shows regular rhythm without murmurs or gallops.  Lungs are clear to auscultation.  Exam of the right toe shows no swelling, deformity, erythema or pain.       Assessment & Plan:  Post-COVID chronic fatigue - Plan: CBC with Differential/Platelet, Comprehensive metabolic panel  Hemorrhoids, unspecified hemorrhoid type Explained that he probably does have post Covid long-haul her symptoms and hopefully this will go away with time. I then described treatment of hemorrhoids both internal and external.  He also describes what could be rectal fissure.  Recommend softer BMs, sits baths and if continued difficulty he will see me or his gastroenterologist.

## 2020-01-15 LAB — COMPREHENSIVE METABOLIC PANEL
AST: 32 IU/L (ref 0–40)
Albumin/Globulin Ratio: 1.9 (ref 1.2–2.2)
BUN/Creatinine Ratio: 13 (ref 10–24)
BUN: 15 mg/dL (ref 8–27)
Bilirubin Total: 0.3 mg/dL (ref 0.0–1.2)
CO2: 25 mmol/L (ref 20–29)
Calcium: 9.5 mg/dL (ref 8.6–10.2)
Chloride: 103 mmol/L (ref 96–106)
GFR calc Af Amer: 75 mL/min/{1.73_m2} (ref >59)
GFR calc non Af Amer: 65 mL/min/{1.73_m2} (ref >59)
Globulin, Total: 2.7 g/dL (ref 1.5–4.5)
Glucose: 70 mg/dL (ref 65–99)
Sodium: 144 mmol/L (ref 134–144)
Total Protein: 7.8 g/dL (ref 6.0–8.5)

## 2020-01-15 LAB — CBC WITH DIFFERENTIAL/PLATELET
Basophils Absolute: 0.1 10*3/uL (ref 0.0–0.2)
EOS (ABSOLUTE): 0.1 10*3/uL (ref 0.0–0.4)
Hematocrit: 47.2 % (ref 37.5–51.0)
Hemoglobin: 16.1 g/dL (ref 13.0–17.7)
Immature Grans (Abs): 0 10*3/uL (ref 0.0–0.1)
MCH: 30.3 pg (ref 26.6–33.0)
MCV: 89 fL (ref 79–97)
Monocytes Absolute: 0.8 10*3/uL (ref 0.1–0.9)
Monocytes: 11 %
Neutrophils Absolute: 3.9 10*3/uL (ref 1.4–7.0)
Neutrophils: 52 %
Platelets: 305 10*3/uL (ref 150–450)
RBC: 5.32 x10E6/uL (ref 4.14–5.80)
WBC: 7.3 10*3/uL (ref 3.4–10.8)

## 2020-01-19 DIAGNOSIS — H2511 Age-related nuclear cataract, right eye: Secondary | ICD-10-CM | POA: Diagnosis not present

## 2020-01-23 DIAGNOSIS — H401111 Primary open-angle glaucoma, right eye, mild stage: Secondary | ICD-10-CM | POA: Diagnosis not present

## 2020-01-23 DIAGNOSIS — H2511 Age-related nuclear cataract, right eye: Secondary | ICD-10-CM | POA: Diagnosis not present

## 2020-03-03 ENCOUNTER — Encounter: Payer: Self-pay | Admitting: Family Medicine

## 2020-03-03 ENCOUNTER — Other Ambulatory Visit: Payer: Self-pay

## 2020-03-03 ENCOUNTER — Ambulatory Visit (INDEPENDENT_AMBULATORY_CARE_PROVIDER_SITE_OTHER): Payer: Medicare Other | Admitting: Family Medicine

## 2020-03-03 VITALS — BP 140/82 | HR 90 | Temp 97.9°F | Wt 169.0 lb

## 2020-03-03 DIAGNOSIS — I700 Atherosclerosis of aorta: Secondary | ICD-10-CM | POA: Diagnosis not present

## 2020-03-03 DIAGNOSIS — I7121 Aneurysm of the ascending aorta, without rupture: Secondary | ICD-10-CM

## 2020-03-03 DIAGNOSIS — R030 Elevated blood-pressure reading, without diagnosis of hypertension: Secondary | ICD-10-CM | POA: Diagnosis not present

## 2020-03-03 DIAGNOSIS — I712 Thoracic aortic aneurysm, without rupture: Secondary | ICD-10-CM

## 2020-03-03 DIAGNOSIS — K649 Unspecified hemorrhoids: Secondary | ICD-10-CM | POA: Diagnosis not present

## 2020-03-03 MED ORDER — ROSUVASTATIN CALCIUM 10 MG PO TABS: 10.0000 mg | ORAL_TABLET | Freq: Every day | ORAL | 3 refills | Status: DC

## 2020-03-03 NOTE — Patient Instructions (Addendum)
Check out the DASH. Treatment for fissure as well as constipation his fluids bulk in your diet exercise. When you have the urge to have a BM have it

## 2020-03-03 NOTE — Progress Notes (Signed)
   Subjective:    Patient ID: Jon Morales, male    DOB: [birth year], 75 y.o.   MRN: [id number]  HPI He is here for recheck on his blood pressure.  He states that he has been getting readings similar to the ones we got today.  At this time he would prefer to not start any medications.  He also has concerns about atherosclerosis.  Review of his record indicates that atherosclerotic changes were seen on a scan in 2018 however no diagnosis was given. He also complains of intermittent difficulty with rectal pain especially with BMs.  He will sometimes see blood with that.  Review of Systems     Objective:   Physical Exam Alert and in no distress.  Blood pressure is recorded. Rectal exam shows no hemorrhoid or active fissuring.      Assessment & Plan:  Atherosclerosis of aorta (HCC) - Plan: Lipid panel, rosuvastatin (CRESTOR) 10 MG tablet, CT CARDIAC SCORING  Hemorrhoids, unspecified hemorrhoid type  Elevated blood pressure reading I explained that since he does have a diagnosis of atherosclerosis, statin medication is appropriate.  He then mentioned wanting further evaluation for atherosclerosis and wanted a cardiac CT scan.  This was ordered at his request. I explained that I did not see a fissure but his symptoms sound like that.  Discussed fluids, bulk in diet, exercise to ensure softer BMs.  Discussed the possibility of using diltiazem if he has difficulty with in the future. I also discussed his blood pressure and at this point he would like to continue to monitor it.

## 2020-03-04 LAB — LIPID PANEL
Chol/HDL Ratio: 3 ratio (ref 0.0–5.0)
Cholesterol, Total: 212 mg/dL — ABNORMAL HIGH (ref 100–199)
HDL: 71 mg/dL (ref >39)
LDL Chol Calc (NIH): 106 mg/dL — ABNORMAL HIGH (ref 0–99)
Triglycerides: 205 mg/dL — ABNORMAL HIGH (ref 0–149)
VLDL Cholesterol Cal: 35 mg/dL (ref 5–40)

## 2020-03-13 ENCOUNTER — Other Ambulatory Visit: Payer: Self-pay

## 2020-03-13 ENCOUNTER — Ambulatory Visit (HOSPITAL_COMMUNITY)
Admission: RE | Admit: 2020-03-13 | Discharge: 2020-03-13 | Disposition: A | Discharge status: Home / Self Care | Payer: Medicare Other | Source: Ambulatory Visit | Attending: Family Medicine | Admitting: Family Medicine

## 2020-03-13 DIAGNOSIS — I700 Atherosclerosis of aorta: Secondary | ICD-10-CM | POA: Diagnosis not present

## 2020-03-13 NOTE — Addendum Note (Signed)
Addended by: Denita Lung on: 03/13/2020 04:56 PM   Modules accepted: Orders

## 2020-03-18 ENCOUNTER — Telehealth: Payer: Self-pay

## 2020-03-18 ENCOUNTER — Other Ambulatory Visit: Payer: Self-pay

## 2020-03-18 DIAGNOSIS — R030 Elevated blood-pressure reading, without diagnosis of hypertension: Secondary | ICD-10-CM

## 2020-03-18 DIAGNOSIS — I700 Atherosclerosis of aorta: Secondary | ICD-10-CM

## 2020-03-18 NOTE — Telephone Encounter (Signed)
Pt. Called back stating that he doesn't have a particular cardiologist in mind he just prefers someone that is more into alternatives and not just putting on drugs if not needed.

## 2020-03-19 NOTE — Telephone Encounter (Signed)
[facility] was put in for Heart care. [place]

## 2020-04-02 DIAGNOSIS — Z800 Family history of malignant neoplasm of digestive organs: Secondary | ICD-10-CM | POA: Diagnosis not present

## 2020-04-02 DIAGNOSIS — K625 Hemorrhage of anus and rectum: Secondary | ICD-10-CM | POA: Diagnosis not present

## 2020-04-02 DIAGNOSIS — K219 Gastro-esophageal reflux disease without esophagitis: Secondary | ICD-10-CM | POA: Diagnosis not present

## 2020-04-02 DIAGNOSIS — K449 Diaphragmatic hernia without obstruction or gangrene: Secondary | ICD-10-CM | POA: Diagnosis not present

## 2020-04-02 DIAGNOSIS — R5383 Other fatigue: Secondary | ICD-10-CM | POA: Diagnosis not present

## 2020-04-02 DIAGNOSIS — K5730 Diverticulosis of large intestine without perforation or abscess without bleeding: Secondary | ICD-10-CM | POA: Diagnosis not present

## 2020-04-02 DIAGNOSIS — R490 Dysphonia: Secondary | ICD-10-CM | POA: Diagnosis not present

## 2020-04-02 DIAGNOSIS — K648 Other hemorrhoids: Secondary | ICD-10-CM | POA: Diagnosis not present

## 2020-04-02 LAB — HM COLONOSCOPY

## 2020-04-03 DIAGNOSIS — K648 Other hemorrhoids: Secondary | ICD-10-CM | POA: Insufficient documentation

## 2020-04-03 DIAGNOSIS — K5730 Diverticulosis of large intestine without perforation or abscess without bleeding: Secondary | ICD-10-CM | POA: Insufficient documentation

## 2020-04-03 DIAGNOSIS — K449 Diaphragmatic hernia without obstruction or gangrene: Secondary | ICD-10-CM | POA: Insufficient documentation

## 2020-04-07 ENCOUNTER — Encounter: Payer: Self-pay | Admitting: Family Medicine

## 2020-04-10 ENCOUNTER — Other Ambulatory Visit: Payer: Self-pay

## 2020-04-10 ENCOUNTER — Ambulatory Visit
Admission: RE | Admit: 2020-04-10 | Discharge: 2020-04-10 | Disposition: A | Discharge status: Home / Self Care | Payer: Medicare Other | Source: Ambulatory Visit | Attending: Family Medicine | Admitting: Family Medicine

## 2020-04-10 DIAGNOSIS — I2510 Atherosclerotic heart disease of native coronary artery without angina pectoris: Secondary | ICD-10-CM | POA: Diagnosis not present

## 2020-04-10 DIAGNOSIS — I712 Thoracic aortic aneurysm, without rupture: Secondary | ICD-10-CM | POA: Diagnosis not present

## 2020-04-10 DIAGNOSIS — K449 Diaphragmatic hernia without obstruction or gangrene: Secondary | ICD-10-CM | POA: Diagnosis not present

## 2020-04-10 DIAGNOSIS — I7121 Aneurysm of the ascending aorta, without rupture: Secondary | ICD-10-CM

## 2020-04-10 DIAGNOSIS — J9811 Atelectasis: Secondary | ICD-10-CM | POA: Diagnosis not present

## 2020-04-10 MED ORDER — IOPAMIDOL (ISOVUE-370) INJECTION 76%
75.0000 mL | Freq: Once | INTRAVENOUS | Status: AC | PRN
  Administered 2020-04-10: 75 mL via INTRAVENOUS

## 2020-04-23 NOTE — Progress Notes (Signed)
Cardiology Office Note:   Date:  04/24/2020  NAME:  GIANG HEMME    MRN: [id number] DOB:  1945/12/11   PCP:  Denita Lung, MD  Cardiologist:  No primary care provider on file.   Referring MD: Denita Lung, MD   Chief Complaint  Patient presents with  . Coronary Artery Disease   History of Present Illness:   NASHAWN HILLOCK is a 75 y.o. male with a hx of CAD, GERD, HLD who is being seen today for the evaluation of CAD at the request of Denita Lung, MD.  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  He presents for evaluation of a recent elevated coronary calcium score.  Coronary score was 403.  65th percentile.  He apparently had repeat labs drawn when he was fasting.  This shows an LDL cholesterol 115.  He does have an HDL cholesterol of 70.  His blood pressure in office was elevated around 160.  On my recheck it is 140/80.  He is of normal body weight.  He reports his father had a history of stroke at 27 years of age.  He is never had a heart attack or stroke.  He reports he does exercise going to the gym 3 days/week.  He also can walk up to 3 to 4 miles without any chest pain or shortness of breath.  He reports his diet is clean.  He reports no fatty or fried foods.  He reports his approach to his overall health is holistic.  He seems to be doing well.  He denies any symptoms in office.  He is a never smoker.  He drinks alcohol moderation.  No illicit drug use reported.  We discussed possibly pursuing statin therapy.  He reports he would like to continue with alternative agents.  I think this is reasonable.  His EKG in office demonstrates sinus rhythm with no acute ischemic changes or evidence of infarction.  Problem List 1. CAD -coronary calcium 403 (65th percentile) 2. HLD -T chol 202, HDL 70, LDL 115, triglycerides 96  Past Medical History: Past Medical History:  Diagnosis Date  . Arthritis   . Coronary artery disease   . GERD (gastroesophageal  reflux disease)   . Lactose intolerance     Past Surgical History: Past Surgical History:  Procedure Laterality Date  . COLONOSCOPY  2010   Dr. Oletta Lamas  . HERNIA REPAIR  10/06/10   left inguinal hernia     Current Medications: Current Meds  Medication Sig  . latanoprost (XALATAN) 0.005 % ophthalmic solution      Allergies:    Levaquin [levofloxacin in d5w] and Sulfa drugs cross reactors   Social History: Social History   Socioeconomic History  . Marital status: Married    Spouse name: Not on file  . Number of children: 3  . Years of education: Not on file  . Highest education level: Not on file  Occupational History  . Occupation: Restaurant manager, fast food  Tobacco Use  . Smoking status: Never Smoker  . Smokeless tobacco: Never Used  Vaping Use  . Vaping Use: Never used  Substance and Sexual Activity  . Alcohol use: Yes    Alcohol/week: 4.0 standard drinks    Types: 4 Cans of beer per week  . Drug use: No  . Sexual activity: Yes  Other Topics Concern  . Not on file  Social History Narrative  . Not on file   Social Determinants  of Health   Financial Resource Strain: Not on file  Food Insecurity: Not on file  Transportation Needs: Not on file  Physical Activity: Not on file  Stress: Not on file  Social Connections: Not on file     Family History: The patient's family history includes Heart disease in his brother; Stroke in his father.  ROS:   All other ROS reviewed and negative. Pertinent positives noted in the HPI.     EKGs/Labs/Other Studies Reviewed:   The following studies were personally reviewed by me today:  EKG:  EKG is ordered today.  The ekg ordered today demonstrates normal sinus rhythm heart rate 84, no acute ischemic changes, no evidence of infarction, and was personally reviewed by me.   Recent Labs: 01/14/2020: ALT 51; BUN 15; Creatinine, Ser 1.13; Hemoglobin 16.1; Platelets 305; Potassium 4.5; Sodium 144   Recent Lipid Panel    Component  Value Date/Time   CHOL 212 (H) 03/03/2020 1446   TRIG 205 (H) 03/03/2020 1446   HDL 71 03/03/2020 1446   CHOLHDL 3.0 03/03/2020 1446   CHOLHDL 2.9 11/02/2012 0857   VLDL 21 11/02/2012 0857   LDLCALC 106 (H) 03/03/2020 1446    Physical Exam:   VS:  BP (!) 160/90 (BP Location: Left Arm, Patient Position: Sitting)   Pulse 84   Ht 5\' 10"  (1.778 m)   Wt 168 lb (76.2 kg)   SpO2 98%   BMI 24.11 kg/m    Wt Readings from Last 3 Encounters:  04/24/20 168 lb (76.2 kg)  03/03/20 169 lb (76.7 kg)  01/14/20 165 lb 12.8 oz (75.2 kg)    General: Well nourished, well developed, in no acute distress Head: Atraumatic, normal size  Eyes: PEERLA, EOMI  Neck: Supple, no JVD Endocrine: No thryomegaly Cardiac: Normal S1, S2; RRR; no murmurs, rubs, or gallops Lungs: Clear to auscultation bilaterally, no wheezing, rhonchi or rales  Abd: Soft, nontender, no hepatomegaly  Ext: No edema, pulses 2+ Musculoskeletal: No deformities, BUE and BLE strength normal and equal Skin: Warm and dry, no rashes   Neuro: Alert and oriented to person, place, time, and situation, CNII-XII grossly intact, no focal deficits  Psych: Normal mood and affect   ASSESSMENT:   DEVERON SHAMOON is a 75 y.o. male who presents for the following: 1. Coronary artery disease involving native coronary artery of native heart without angina pectoris   2. Agatston coronary artery calcium score greater than 400   3. Mixed hyperlipidemia     PLAN:   1. Coronary artery disease involving native coronary artery of native heart without angina pectoris 2. Agatston coronary artery calcium score greater than 400 3. Mixed hyperlipidemia -Recent coronary calcium score 403 which is 65th percentile.  He has no symptoms of chest pain or shortness of breath.  No strong family history of heart disease.  His father had a stroke at age 73.  His EKG in office demonstrates sinus rhythm with no acute ischemic changes or evidence of infarction.  His  cardiovascular examination is normal. -We discussed pursuing an exercise treadmill stress test just to make sure he has no evidence of dangerous blockages.  He is okay to do this. -We also discussed possibly pursuing statin therapy.  His LDL cholesterol 115.  Ideally this should be less than 70.  It is reassuring he has exceedingly high good HDL cholesterol of 70.  For now he reports he would like to continue with exercise and dietary modifications.  I think this is  reasonable.  He does work as a Restaurant manager, fast food.  He reports that his approach is more holistic.  This is acceptable.  We will plan to see him on a yearly basis.  He seems to be doing well without any symptoms today in office.  Disposition: Return in about 1 year (around 04/24/2021).  Medication Adjustments/Labs and Tests Ordered: Current medicines are reviewed at length with the patient today.  Concerns regarding medicines are outlined above.  Orders Placed This Encounter  Procedures  . EXERCISE TOLERANCE TEST (ETT)  . EKG 12-Lead   No orders of the defined types were placed in this encounter.   Patient Instructions  Medication Instructions:  The current medical regimen is effective;  continue present plan and medications.  *If you need a refill on your cardiac medications before your next appointment, please call your pharmacy*   Lab Work: COVID TEST NEEDED  If you have labs (blood work) drawn today and your tests are completely normal, you will receive your results only by: Marland Kitchen MyChart Message (if you have MyChart) OR . A paper copy in the mail If you have any lab test that is abnormal or we need to change your treatment, we will call you to review the results.   Testing/Procedures: Your physician has requested that you have an exercise tolerance test, this is a screening tool to track your fitness level. This test evaluates the your exercise capacity by measuring cardiovascular response to exercise, the stress response is  induced by exercise (exercise-treadmill).  Graded exercise test is also known as maximal exercise test or stress EKG test  . Please also follow instruction sheet given.   Follow-Up: At [facility], you and your health needs are our priority.  As part of our continuing mission to provide you with exceptional heart care, we have created designated Provider Care Teams.  These Care Teams include your primary Cardiologist (physician) and Advanced Practice Providers (APPs -  Physician Assistants and Nurse Practitioners) who all work together to provide you with the care you need, when you need it.  We recommend signing up for the patient portal called "MyChart".  Sign up information is provided on this After Visit Summary.  MyChart is used to connect with patients for Virtual Visits (Telemedicine).  Patients are able to view lab/test results, encounter notes, upcoming appointments, etc.  Non-urgent messages can be sent to your provider as well.   To learn more about what you can do with MyChart, go to [email or web address].    Your next appointment:   12 month(s)  The format for your next appointment:   In Person  Provider:   Eleonore Chiquito, MD        Signed, Addison Naegeli. Audie Box, MD, [place]  [address] [place], [place] [address] [phone]  04/24/2020 9:10 AM

## 2020-04-24 ENCOUNTER — Ambulatory Visit (INDEPENDENT_AMBULATORY_CARE_PROVIDER_SITE_OTHER): Payer: Medicare Other | Admitting: Cardiovascular Disease

## 2020-04-24 ENCOUNTER — Other Ambulatory Visit: Payer: Self-pay

## 2020-04-24 ENCOUNTER — Telehealth: Payer: Self-pay | Admitting: Cardiovascular Disease

## 2020-04-24 ENCOUNTER — Encounter: Payer: Self-pay | Admitting: Cardiovascular Disease

## 2020-04-24 VITALS — BP 140/80 | HR 84 | Ht 70.0 in | Wt 168.0 lb

## 2020-04-24 DIAGNOSIS — R931 Abnormal findings on diagnostic imaging of heart and coronary circulation: Secondary | ICD-10-CM | POA: Diagnosis not present

## 2020-04-24 DIAGNOSIS — E782 Mixed hyperlipidemia: Secondary | ICD-10-CM | POA: Diagnosis not present

## 2020-04-24 DIAGNOSIS — I2510 Atherosclerotic heart disease of native coronary artery without angina pectoris: Secondary | ICD-10-CM

## 2020-04-24 NOTE — Patient Instructions (Signed)
Medication Instructions:  The current medical regimen is effective;  continue present plan and medications.  *If you need a refill on your cardiac medications before your next appointment, please call your pharmacy*   Lab Work: COVID TEST NEEDED  If you have labs (blood work) drawn today and your tests are completely normal, you will receive your results only by: Marland Kitchen MyChart Message (if you have MyChart) OR . A paper copy in the mail If you have any lab test that is abnormal or we need to change your treatment, we will call you to review the results.   Testing/Procedures: Your physician has requested that you have an exercise tolerance test, this is a screening tool to track your fitness level. This test evaluates the your exercise capacity by measuring cardiovascular response to exercise, the stress response is induced by exercise (exercise-treadmill).  Graded exercise test is also known as maximal exercise test or stress EKG test  . Please also follow instruction sheet given.   Follow-Up: At [facility], you and your health needs are our priority.  As part of our continuing mission to provide you with exceptional heart care, we have created designated Provider Care Teams.  These Care Teams include your primary Cardiologist (physician) and Advanced Practice Providers (APPs -  Physician Assistants and Nurse Practitioners) who all work together to provide you with the care you need, when you need it.  We recommend signing up for the patient portal called "MyChart".  Sign up information is provided on this After Visit Summary.  MyChart is used to connect with patients for Virtual Visits (Telemedicine).  Patients are able to view lab/test results, encounter notes, upcoming appointments, etc.  Non-urgent messages can be sent to your provider as well.   To learn more about what you can do with MyChart, go to [email or web address].    Your next appointment:   12 month(s)  The format for your  next appointment:   In Person  Provider:   Eleonore Chiquito, MD

## 2020-04-24 NOTE — Telephone Encounter (Signed)
3.11.22 Pt did not schedule ETT today, wanted to wait due to COVID pre-procedure testing restrictions and work schedule LP

## 2020-06-05 DIAGNOSIS — Z961 Presence of intraocular lens: Secondary | ICD-10-CM | POA: Diagnosis not present

## 2020-06-05 DIAGNOSIS — H401131 Primary open-angle glaucoma, bilateral, mild stage: Secondary | ICD-10-CM | POA: Diagnosis not present

## 2020-06-05 DIAGNOSIS — H35343 Macular cyst, hole, or pseudohole, bilateral: Secondary | ICD-10-CM | POA: Diagnosis not present

## 2020-07-21 ENCOUNTER — Encounter: Payer: Self-pay | Admitting: Dermatology

## 2020-07-21 ENCOUNTER — Other Ambulatory Visit: Payer: Self-pay

## 2020-07-21 ENCOUNTER — Ambulatory Visit (INDEPENDENT_AMBULATORY_CARE_PROVIDER_SITE_OTHER): Payer: Medicare Other | Admitting: Dermatology

## 2020-07-21 DIAGNOSIS — L821 Other seborrheic keratosis: Secondary | ICD-10-CM | POA: Diagnosis not present

## 2020-07-21 DIAGNOSIS — Z1283 Encounter for screening for malignant neoplasm of skin: Secondary | ICD-10-CM | POA: Diagnosis not present

## 2020-07-21 DIAGNOSIS — B353 Tinea pedis: Secondary | ICD-10-CM | POA: Diagnosis not present

## 2020-07-21 DIAGNOSIS — L570 Actinic keratosis: Secondary | ICD-10-CM | POA: Diagnosis not present

## 2020-07-21 DIAGNOSIS — I2510 Atherosclerotic heart disease of native coronary artery without angina pectoris: Secondary | ICD-10-CM | POA: Diagnosis not present

## 2020-07-21 NOTE — Progress Notes (Addendum)
   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  Spot on right ear bled plus several other spots to check as well as toenails left foot Location:  Duration:  Quality:  Associated Signs/Symptoms: Modifying Factors:  Severity:  Timing: Context:   Objective  Well appearing patient in no apparent distress; mood and affect are within normal limits. Left 5th Proximal Nail fold of Toe, Left Hallux Toe Nail Plate Distal opacification, yellowing, subungual debris involving the great left toenail.  Right Scaphoid Fossa (2) Dr. Cornell Barman and I both carefully examined his right lower ear.  Historically there was a spot that may have bled twice, but there is currently no erosion nor any palpable lesion that he or I could find.  There were 2 noncontiguous subtly crusted 19mm lesions that fit actinic keratoses and these will be treated with freezing.   Mid Back Stable skin examined.  No atypical pigmented lesions or nonmelanoma skin cancers   All skin waist up examined.  Plus feet.   Assessment & Plan   And LN2 x2 on right ear lobule x1 on left crown Seborrheic keratosis Left Upper Back  Tinea pedis of left foot (2) Left Hallux Toe Nail Plate; Left 5th Proximal Nail fold of Toe  I discussed essentially all treatment options including topicals, laser, surgery, and oral antifungals.  For now his nails are free of symptoms and no intervention is planned.  AK (actinic keratosis) (2) Right Scaphoid Fossa  Destruction of lesion - Right Scaphoid Fossa Complexity: simple   Destruction method: cryotherapy   Informed consent: discussed and consent obtained   Lesion destroyed using liquid nitrogen: Yes   Cryotherapy cycles:  3 Outcome: patient tolerated procedure well with no complications    Encounter for screening for malignant neoplasm of skin Mid Back  Annual skin  examination.     I, Lavonna Monarch, MD, have reviewed all documentation for this visit.  The documentation on 07/28/20 for the exam, diagnosis, procedures, and orders are all accurate and complete.

## 2020-07-24 ENCOUNTER — Encounter (HOSPITAL_COMMUNITY): Payer: Medicare Other

## 2020-07-25 ENCOUNTER — Encounter: Payer: Self-pay | Admitting: Dermatology

## 2020-12-10 ENCOUNTER — Ambulatory Visit (INDEPENDENT_AMBULATORY_CARE_PROVIDER_SITE_OTHER): Payer: Medicare Other | Admitting: Otolaryngology

## 2020-12-10 ENCOUNTER — Other Ambulatory Visit: Payer: Self-pay

## 2020-12-10 DIAGNOSIS — K219 Gastro-esophageal reflux disease without esophagitis: Secondary | ICD-10-CM | POA: Diagnosis not present

## 2020-12-10 DIAGNOSIS — J310 Chronic rhinitis: Secondary | ICD-10-CM

## 2020-12-10 DIAGNOSIS — Z87898 Personal history of other specified conditions: Secondary | ICD-10-CM

## 2020-12-10 DIAGNOSIS — I2510 Atherosclerotic heart disease of native coronary artery without angina pectoris: Secondary | ICD-10-CM

## 2020-12-10 NOTE — Progress Notes (Signed)
HPI: Jon Morales is a 75 y.o. male who returns today for evaluation of voice as well as complaints of nasal drainage.  He has had previous history of vocal cord polyp and reflux.  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  He continues to work 4 days a week.  He has been healthy although he did contract COVID and was sick with COVID about a year ago.  He never got vaccinated for COVID.  He does have history of a hiatal hernia and history of reflux.  He does not smoke. On conversation in the office today he has no significant hoarseness..  Past Medical History:  Diagnosis Date   Arthritis    Coronary artery disease    GERD (gastroesophageal reflux disease)    Lactose intolerance    Past Surgical History:  Procedure Laterality Date   COLONOSCOPY  2010   Dr. Oletta Lamas   HERNIA REPAIR  10/06/10   left inguinal hernia    Social History   Socioeconomic History   Marital status: Married    Spouse name: Not on file   Number of children: 3   Years of education: Not on file   Highest education level: Not on file  Occupational History   Occupation: Chiropractor  Tobacco Use   Smoking status: Never   Smokeless tobacco: Never  Vaping Use   Vaping Use: Never used  Substance and Sexual Activity   Alcohol use: Yes    Alcohol/week: 4.0 standard drinks    Types: 4 Cans of beer per week   Drug use: No   Sexual activity: Yes  Other Topics Concern   Not on file  Social History Narrative   Not on file   Social Determinants of Health   Financial Resource Strain: Not on file  Food Insecurity: Not on file  Transportation Needs: Not on file  Physical Activity: Not on file  Stress: Not on file  Social Connections: Not on file   Family History  Problem Relation Age of Onset   Stroke Father    Heart disease Brother    Allergies  Allergen Reactions   Levaquin [Levofloxacin In D5w] Shortness Of Breath   Sulfa Drugs Cross Reactors     childhood   Prior to  Admission medications   Medication Sig Start Date End Date Taking? Authorizing Provider  latanoprost (XALATAN) 0.005 % ophthalmic solution  10/04/19   [provider]     Positive ROS: Otherwise negative  All other systems have been reviewed and were otherwise negative with the exception of those mentioned in the HPI and as above.  Physical Exam: Constitutional: Alert, well-appearing, no acute distress.  Speech is essentially normal today in the office. Ears: External ears without lesions or tenderness. Ear canals are clear bilaterally with intact, clear TMs.  Nasal: External nose without lesions. Septum slightly deviated to the right.  On anterior rhinoscopy nasal passages were clear bilaterally with clear nasal passages and no polyps.  Both middle meatus regions were clear with no significant discharge.  Mucus within the nasal cavity is clear..  Oral: Lips and gums without lesions. Tongue and palate mucosa without lesions. Posterior oropharynx clear. Fiberoptic laryngoscopy was performed to the left side.  The middle meatus region was clear.  Nasopharynx was clear.  The base of tongue vallecula and epiglottis were normal.  The vocal cords were clear bilaterally with normal vocal mobility.  No polyps or lesions noted.  Both piriform sinuses were clear.  Mild arytenoid edema but no mucosal lesions noted.  No ulcerations noted.  Vocal cords were clear bilaterally with normal vocal mobility. Neck: No palpable adenopathy or masses Respiratory: Breathing comfortably  Skin: No facial/neck lesions or rash noted.  Laryngoscopy  Date/Time: 12/10/2020 2:56 PM Performed by: Rozetta Nunnery, MD Authorized by: Rozetta Nunnery, MD   Consent:    Consent obtained:  Verbal   Consent given by:  Patient Procedure details:    Indications: direct visualization of the upper aerodigestive tract     Medication:  Afrin   Instrument: flexible fiberoptic laryngoscope     Scope location:  left nare   Sinus:    Left middle meatus: normal     Left nasopharynx: normal   Mouth:    Oropharynx: normal     Vallecula: normal     Base of tongue: normal     Epiglottis: normal   Throat:    Pyriform sinus: normal     True vocal cords: normal   Comments:     Full cords were clear bilaterally with normal vocal mobility..  Minimal arytenoid mucosal edema with no mucosal lesions noted.  Assessment: Postnasal drainage History of reflux with hiatal hernia.  Plan: Reviewed with him concerning use of nasal steroid spray and Flonase or Nasacort as well as use of saline rinse for postnasal drainage.  Suggested using Prilosec or similar med for reflux disease as needed.  Reassured him of normal vocal cord examination on fiberoptic laryngoscopy. He will follow-up as needed and discussed with him concerning my retirement and follow-up with one of the other ENT groups if needed.   Radene Journey, MD

## 2021-01-15 ENCOUNTER — Encounter: Payer: Self-pay | Admitting: Family Medicine

## 2021-01-15 ENCOUNTER — Ambulatory Visit (INDEPENDENT_AMBULATORY_CARE_PROVIDER_SITE_OTHER): Payer: Medicare Other | Admitting: Family Medicine

## 2021-01-15 ENCOUNTER — Other Ambulatory Visit: Payer: Self-pay

## 2021-01-15 VITALS — BP 144/88 | HR 98 | Temp 97.7°F | Wt 169.4 lb

## 2021-01-15 DIAGNOSIS — I2510 Atherosclerotic heart disease of native coronary artery without angina pectoris: Secondary | ICD-10-CM

## 2021-01-15 DIAGNOSIS — H35343 Macular cyst, hole, or pseudohole, bilateral: Secondary | ICD-10-CM | POA: Diagnosis not present

## 2021-01-15 DIAGNOSIS — R739 Hyperglycemia, unspecified: Secondary | ICD-10-CM

## 2021-01-15 DIAGNOSIS — I7121 Aneurysm of the ascending aorta, without rupture: Secondary | ICD-10-CM

## 2021-01-15 DIAGNOSIS — I700 Atherosclerosis of aorta: Secondary | ICD-10-CM

## 2021-01-15 DIAGNOSIS — N1831 Chronic kidney disease, stage 3a: Secondary | ICD-10-CM | POA: Diagnosis not present

## 2021-01-15 DIAGNOSIS — Z961 Presence of intraocular lens: Secondary | ICD-10-CM | POA: Diagnosis not present

## 2021-01-15 DIAGNOSIS — H401131 Primary open-angle glaucoma, bilateral, mild stage: Secondary | ICD-10-CM | POA: Diagnosis not present

## 2021-01-15 NOTE — Progress Notes (Signed)
   Subjective:    Patient ID: Jon Morales, male    DOB: 11-17-1945, 75 y.o.   MRN: [id number]  HPI He is here for consult concerning blood work that he had recently done.  It did show slightly elevated blood sugar and he states that that has been that way for several years.  Also showed a GFR of 56 which is again slightly lower than it has been in the past.  His lipids numbers look good although he does have a history of aortic atherosclerosis.  He also has a history of aortic aneurysm and is being monitored for that.  He has seen cardiology.    Review of Systems     Objective:   Physical Exam Alert and in no distress otherwise not examined       Assessment & Plan:   Atherosclerosis of aorta (HCC)  Aneurysm of ascending aorta without rupture  Stage 3a chronic kidney disease ([place]) - Plan: Comprehensive metabolic panel  Elevated blood sugar - Plan: Hemoglobin A1c Discussed the need for statin based on his aortic atherosclerosis as well as age and male.  He is also been informed of that by his cardiologist but is still not interested. I then discussed the CKD with him indicating that this could also be an issue of hydration and strongly encouraged him to stay hydrated.  Explained that 1 measure of his kidney function is not enough and we will check that again.

## 2021-01-16 LAB — COMPREHENSIVE METABOLIC PANEL
ALT: 45 IU/L — ABNORMAL HIGH (ref 0–44)
AST: 28 IU/L (ref 0–40)
Albumin/Globulin Ratio: 2.2 (ref 1.2–2.2)
Albumin: 5.2 g/dL — ABNORMAL HIGH (ref 3.7–4.7)
Alkaline Phosphatase: 64 IU/L (ref 44–121)
BUN/Creatinine Ratio: 16 (ref 10–24)
BUN: 19 mg/dL (ref 8–27)
Bilirubin Total: <0.2 mg/dL (ref 0.0–1.2)
CO2: 26 mmol/L (ref 20–29)
Calcium: 10 mg/dL (ref 8.6–10.2)
Chloride: 100 mmol/L (ref 96–106)
Creatinine, Ser: 1.18 mg/dL (ref 0.76–1.27)
Globulin, Total: 2.4 g/dL (ref 1.5–4.5)
Glucose: 124 mg/dL — ABNORMAL HIGH (ref 70–99)
Potassium: 4.3 mmol/L (ref 3.5–5.2)
Sodium: 138 mmol/L (ref 134–144)
Total Protein: 7.6 g/dL (ref 6.0–8.5)
eGFR: 65 mL/min/{1.73_m2} (ref >59)

## 2021-01-28 ENCOUNTER — Telehealth: Payer: Self-pay | Admitting: Family Medicine

## 2021-01-28 NOTE — Telephone Encounter (Signed)
Spoke with patient to schedule Medicare Annual Wellness Visit (AWV) either virtually or in office.  Patient stated he would call back to schedule  Last AWV  03/24/17 ; please schedule at anytime with health coach  This should be a 45 minute visit.

## 2021-03-24 ENCOUNTER — Telehealth: Payer: Self-pay | Admitting: Family Medicine

## 2021-03-24 NOTE — Telephone Encounter (Signed)
Left message for patient to call back and schedule Medicare Annual Wellness Visit (AWV) either virtually or in office. I left my number for patient to call [phone].  Last AWV 03/24/17 please schedule at anytime with health coach  This should be a 45 minute visit.

## 2021-04-22 ENCOUNTER — Telehealth: Payer: Self-pay | Admitting: Cardiovascular Disease

## 2021-04-22 ENCOUNTER — Other Ambulatory Visit: Payer: Self-pay

## 2021-04-22 DIAGNOSIS — I2510 Atherosclerotic heart disease of native coronary artery without angina pectoris: Secondary | ICD-10-CM

## 2021-04-22 NOTE — Telephone Encounter (Signed)
?  Patient has an appointment scheduled with Dr Audie Box in May and is asking if orders can be placed to have his stress test before the visit. Please advise. ?

## 2021-04-28 ENCOUNTER — Telehealth (HOSPITAL_COMMUNITY): Payer: Self-pay | Admitting: *Deleted

## 2021-04-28 NOTE — Telephone Encounter (Signed)
Close encounter 

## 2021-04-29 ENCOUNTER — Ambulatory Visit (HOSPITAL_COMMUNITY)
Admission: RE | Admit: 2021-04-29 | Discharge: 2021-04-29 | Disposition: A | Discharge status: Home / Self Care | Payer: Medicare Other | Source: Ambulatory Visit | Attending: Cardiology | Admitting: Cardiology

## 2021-04-29 ENCOUNTER — Ambulatory Visit (INDEPENDENT_AMBULATORY_CARE_PROVIDER_SITE_OTHER): Payer: Medicare Other | Admitting: Cardiology

## 2021-04-29 ENCOUNTER — Other Ambulatory Visit: Payer: Self-pay

## 2021-04-29 ENCOUNTER — Encounter: Payer: Self-pay | Admitting: Cardiology

## 2021-04-29 VITALS — BP 150/94 | HR 100 | Ht 70.0 in | Wt 166.2 lb

## 2021-04-29 DIAGNOSIS — R9431 Abnormal electrocardiogram [ECG] [EKG]: Secondary | ICD-10-CM

## 2021-04-29 DIAGNOSIS — I4729 Other ventricular tachycardia: Secondary | ICD-10-CM

## 2021-04-29 DIAGNOSIS — I2510 Atherosclerotic heart disease of native coronary artery without angina pectoris: Secondary | ICD-10-CM

## 2021-04-29 DIAGNOSIS — R9430 Abnormal result of cardiovascular function study, unspecified: Secondary | ICD-10-CM

## 2021-04-29 DIAGNOSIS — E782 Mixed hyperlipidemia: Secondary | ICD-10-CM | POA: Diagnosis not present

## 2021-04-29 LAB — EXERCISE TOLERANCE TEST
Angina Index: 0
Duke Treadmill Score: -12
Estimated workload: 10.1
Exercise duration (min): 8 min
Exercise duration (sec): 9 s
MPHR: 147 {beats}/min
Peak HR: 169 {beats}/min
Percent HR: 114 %
Rest HR: 79 {beats}/min
ST Depression (mm): 4 mm

## 2021-04-29 MED ORDER — METOPROLOL SUCCINATE ER 25 MG PO TB24: 25.0000 mg | ORAL_TABLET | Freq: Every day | ORAL | 3 refills | Status: DC

## 2021-04-29 MED ORDER — METOPROLOL TARTRATE 100 MG PO TABS: ORAL_TABLET | ORAL | 0 refills | Status: DC

## 2021-04-29 MED ORDER — ASPIRIN EC 81 MG PO TBEC: 81.0000 mg | DELAYED_RELEASE_TABLET | Freq: Every day | ORAL | 3 refills | Status: DC

## 2021-04-29 NOTE — Addendum Note (Signed)
Addended by: Cristopher Estimable on: 04/29/2021 11:24 AM ? ? Modules accepted: Orders ? ?

## 2021-04-29 NOTE — Progress Notes (Signed)
? ? ? ? ?HPI: Follow-up coronary artery disease and nonsustained ventricular tachycardia noted on treadmill.  Previously seen by Dr. Audie Box for elevated calcium score (January 2022 calcium score 403 which was 65th percentile).  Exercise treadmill had been recommended but was postponed at that time due to COVID.  He presented for his treadmill today and was noted to have nonsustained ventricular tachycardia.  Added to my schedule.  Patient is very active.  He has dyspnea with more vigorous activities but not routine activities.  No orthopnea, PND, pedal edema, exertional chest pain or syncope.  He does have a history of occasional palpitations when exercising more vigorously.  On the treadmill today he did not have chest pain or dyspnea but did feel a fluttering sensation in his chest. ? ?Current Outpatient Medications  ?Medication Sig Dispense Refill  ? latanoprost (XALATAN) 0.005 % ophthalmic solution     ? ?No current facility-administered medications for this visit.  ? ? ? ?Past Medical History:  ?Diagnosis Date  ? Arthritis   ? Coronary artery disease   ? GERD (gastroesophageal reflux disease)   ? Lactose intolerance   ? ? ?Past Surgical History:  ?Procedure Laterality Date  ? COLONOSCOPY  2010  ? Dr. Oletta Lamas  ? HERNIA REPAIR  10/06/10  ? left inguinal hernia   ? ? ?Social History  ? ?Socioeconomic History  ? Marital status: Married  ?  Spouse name: Not on file  ? Number of children: 3  ? Years of education: Not on file  ? Highest education level: Not on file  ?Occupational History  ? Occupation: Restaurant manager, fast food  ?Tobacco Use  ? Smoking status: Never  ? Smokeless tobacco: Never  ?Vaping Use  ? Vaping Use: Never used  ?Substance and Sexual Activity  ? Alcohol use: Yes  ?  Alcohol/week: 4.0 standard drinks  ?  Types: 4 Cans of beer per week  ? Drug use: No  ? Sexual activity: Yes  ?Other Topics Concern  ? Not on file  ?Social History Narrative  ? Not on file  ? ?Social Determinants of Health  ? ?Financial Resource  Strain: Not on file  ?Food Insecurity: Not on file  ?Transportation Needs: Not on file  ?Physical Activity: Not on file  ?Stress: Not on file  ?Social Connections: Not on file  ?Intimate Partner Violence: Not on file  ? ? ?Family History  ?Problem Relation Age of Onset  ? Stroke Father   ? Heart disease Brother   ? ? ?ROS: no fevers or chills, productive cough, hemoptysis, dysphasia, odynophagia, melena, hematochezia, dysuria, hematuria, rash, seizure activity, orthopnea, PND, pedal edema, claudication. Remaining systems are negative. ? ?Physical Exam: ?Well-developed well-nourished in no acute distress.  ?Skin is warm and dry.  ?HEENT is normal.  ?Neck is supple.  ?Chest is clear to auscultation with normal expansion.  ?Cardiovascular exam is regular rate and rhythm.  ?Abdominal exam nontender or distended. No masses palpated. ?Extremities show no edema. ?neuro grossly intact ? ?ECG-normal sinus rhythm at a rate of 83, no significant ST changes.  Personally reviewed ?Exercise treadmill showed episodes of nonsustained ventricular tachycardia longest 15 beats ? ?A/P ? ?1 coronary artery disease-based on previously elevated calcium score.  Add aspirin 81 mg daily.  I recommended a statin today.  He would like to discuss this with Dr. Audie Box prior to initiating. ? ?2 hyperlipidemia-as above I recommended a statin (given documented coronary disease patient should begin Crestor 40 mg daily). ? ?3 nonsustained ventricular tachycardia-noted on  exercise treadmill.  We will arrange echocardiogram to assess LV function.  I will also arrange a cardiac CTA to exclude obstructive coronary disease.  I have added Toprol 25 mg daily.  I asked him to not exercise until we have the results of his above tests. ? ?Kirk Ruths, MD ? ? ? ?

## 2021-04-29 NOTE — Addendum Note (Signed)
Addended by: Cristopher Estimable on: 04/29/2021 11:21 AM ? ? Modules accepted: Orders ? ?

## 2021-04-29 NOTE — Patient Instructions (Signed)
Medication Instructions:  ? ?START ASPIRIN 81 MG ONCE DAILY ? ?START METOPROLOL SUCC ER 25 MG ONCE DAILY ? ?*If you need a refill on your cardiac medications before your next appointment, please call your pharmacy* ? ? ?Testing/Procedures: ? ?Your physician has requested that you have an echocardiogram. Echocardiography is a painless test that uses sound waves to create images of your heart. It provides your doctor with information about the size and shape of your heart and how well your heart?s chambers and valves are working. This procedure takes approximately one hour. There are no restrictions for this procedure. [place] ? ? ? ? ?Your cardiac CT will be scheduled at  ? ?[facility] ?[address] ?[place], [place] [address] ?[phone] ? ? ?If scheduled at [facility], please arrive at the [facility] and Children's Entrance (Entrance C2) of [facility] 30 minutes prior to test start time. ?You can use the FREE valet parking offered at entrance C (encouraged to control the heart rate for the test)  ?Proceed to the [facility] Radiology Department (first floor) to check-in and test prep. ? ?All radiology patients and guests should use entrance C2 at [facility], accessed from [facility], even though the hospital's physical address listed is [address] ? ? ? ? ?Please follow these instructions carefully (unless otherwise directed): ? ?Hold all erectile dysfunction medications at least 3 days (72 hrs) prior to test. ? ?On the Night Before the Test: ?Be sure to Drink plenty of water. ?Do not consume any caffeinated/decaffeinated beverages or chocolate 12 hours prior to your test. ?Do not take any antihistamines 12 hours prior to your test. ? ?On the Day of the Test: ?Drink plenty of water until 1 hour prior to the test. ?Do not eat any food 4 hours prior to the test. ?You may take your regular medications prior to the test.  ?Take  metoprolol (Lopressor) 100 MG two hours prior to test. ?HOLD Furosemide/Hydrochlorothiazide morning of the test. ?     ?After the Test: ?Drink plenty of water. ?After receiving IV contrast, you may experience a mild flushed feeling. This is normal. ?On occasion, you may experience a mild rash up to 24 hours after the test. This is not dangerous. If this occurs, you can take Benadryl 25 mg and increase your fluid intake. ?If you experience trouble breathing, this can be serious. If it is severe call 911 IMMEDIATELY. If it is mild, please call our office. ?If you take any of these medications: Glipizide/Metformin, Avandament, Glucavance, please do not take 48 hours after completing test unless otherwise instructed. ? ?We will call to schedule your test 2-4 weeks out understanding that some insurance companies will need an authorization prior to the service being performed.  ? ?For non-scheduling related questions, please contact the cardiac imaging nurse navigator should you have any questions/concerns: ?Marchia Bond, Cardiac Imaging Nurse Navigator ?Gordy Clement, Cardiac Imaging Nurse Navigator ?[facility] ?Direct Office Dial: [phone]  ? ?For scheduling needs, including cancellations and rescheduling, please call [place], [phone].  ? ? ?Follow-Up: ?At [facility], you and your health needs are our priority.  As part of our continuing mission to provide you with exceptional heart care, we have created designated Provider Care Teams.  These Care Teams include your primary Cardiologist (physician) and Advanced Practice Providers (APPs -  Physician Assistants and Nurse Practitioners) who all work together to provide you with the care  you need, when you need it. ? ?We recommend signing up for the patient portal called "MyChart".  Sign up information is provided on this After Visit Summary.  MyChart is used to connect with patients for Virtual Visits (Telemedicine).  Patients are  able to view lab/test results, encounter notes, upcoming appointments, etc.  Non-urgent messages can be sent to your provider as well.   ?To learn more about what you can do with MyChart, go to [email or web address].   ? ?Your next appointment:   ? ?AFTER TESTING COMPLETE WITH DR O'NEIL ?

## 2021-05-06 NOTE — Progress Notes (Signed)
?Cardiology Office Note:   ?Date:  05/07/2021  ?NAME:  Jon Morales    ?MRN: [id number] ?DOB:  08-25-1945  ? ?PCP:  Denita Lung, MD  ?Cardiologist:  None  ?Electrophysiologist:  None  ? ?Referring MD: Denita Lung, MD  ? ?Chief Complaint  ?Patient presents with  ? Follow-up  ?   ?  ? ? ?History of Present Illness:   ?Jon Morales is a 76 y.o. male with a hx of CAD who presents for follow-up. Abnormal ETT. 4 mm ST depression. NSVT noted. He reports he is doing well.  EKG is normal.  Denies any significant chest pressure.  Occasional tightness in his chest.  He reports he walks 4 miles other day without limitations.  He was prescribed aspirin but did not take it.  I recommended he start this.  Also prescribed metoprolol succinate 25 mg daily.  Would recommend he take this as well.  We discussed statin therapy.  He would like to hold on this.  He would like to see what the results of the CT scan show.  Also has an echo pending.  No history of CAD.  This is rather alarming.  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  Denies any major symptoms but does get occasional chest tightness.  I did inform him that CT scan will help exclude any dangerous blockages.  He is okay to proceed with this. ? ?Problem List ?1. CAD ?-coronary calcium 403 (65th percentile) ?2. HLD ?-T chol 212, HDL 71, LDL 106, 205 ? ?Past Medical History: ?Past Medical History:  ?Diagnosis Date  ? Arthritis   ? Coronary artery disease   ? GERD (gastroesophageal reflux disease)   ? Lactose intolerance   ? ? ?Past Surgical History: ?Past Surgical History:  ?Procedure Laterality Date  ? COLONOSCOPY  2010  ? Dr. Oletta Lamas  ? HERNIA REPAIR  10/06/10  ? left inguinal hernia   ? ? ?Current Medications: ?Current Meds  ?Medication Sig  ? aspirin EC 81 MG tablet Take 1 tablet (81 mg total) by mouth daily. Swallow whole.  ? latanoprost (XALATAN) 0.005 % ophthalmic solution   ? metoprolol succinate (TOPROL XL) 25 MG 24 hr tablet Take 1 tablet (25 mg total) by mouth  daily.  ? metoprolol tartrate (LOPRESSOR) 100 MG tablet TAKE 2 HOURS PRIOR TO CT SCAN  ? sildenafil (VIAGRA) 25 MG tablet Take 1 tablet (25 mg total) by mouth daily as needed for erectile dysfunction.  ?  ? ?Allergies:    ?Levaquin [levofloxacin in d5w] and Sulfa drugs cross reactors  ? ?Social History: ?Social History  ? ?Socioeconomic History  ? Marital status: Married  ?  Spouse name: Not on file  ? Number of children: 3  ? Years of education: Not on file  ? Highest education level: Not on file  ?Occupational History  ? Occupation: Restaurant manager, fast food  ?Tobacco Use  ? Smoking status: Never  ? Smokeless tobacco: Never  ?Vaping Use  ? Vaping Use: Never used  ?Substance and Sexual Activity  ? Alcohol use: Yes  ?  Alcohol/week: 4.0 standard drinks  ?  Types: 4 Cans of beer per week  ? Drug use: No  ? Sexual activity: Yes  ?Other Topics Concern  ? Not on file  ?Social History Narrative  ? Not on file  ? ?Social Determinants of Health  ? ?Financial Resource Strain: Not on file  ?Food Insecurity: Not on file  ?Transportation Needs: Not on file  ?Physical Activity: Not  on file  ?Stress: Not on file  ?Social Connections: Not on file  ?  ? ?Family History: ?The patient's family history includes Heart disease in his brother; Stroke in his father. ? ?ROS:   ?All other ROS reviewed and negative. Pertinent positives noted in the HPI.    ? ?EKGs/Labs/Other Studies Reviewed:   ?The following studies were personally reviewed by me today: ? ?EKG:  EKG is ordered today.  The ekg ordered today demonstrates normal sinus rhythm heart rate 75, no acute ischemic changes or evidence of infarction, and was personally reviewed by me.  ? ?ETT 04/29/2021 ?  4.0 mm of horizontal ST depression was noted. ?  ?Abnormal exercise stress test with up to 4 mm horizontal ST depression noted during peak exercise. The patient experienced NSVT as well as an 8-10 beat run of SVT with aberrancy/ST depression. Findings concerning for ischemia. Further evaluation  of coronary perfusion is warranted. ? ?Recent Labs: ?01/15/2021: ALT 45; BUN 19; Creatinine, Ser 1.18; Potassium 4.3; Sodium 138  ? ?Recent Lipid Panel ?   ?Component Value Date/Time  ? CHOL 212 (H) 03/03/2020 1446  ? TRIG 205 (H) 03/03/2020 1446  ? HDL 71 03/03/2020 1446  ? CHOLHDL 3.0 03/03/2020 1446  ? CHOLHDL 2.9 11/02/2012 0857  ? VLDL 21 11/02/2012 0857  ? [place] 106 (H) 03/03/2020 1446  ? ? ?Physical Exam:   ?VS:  BP (!) 154/90   Pulse 75   Ht '5\' 10"'$  (1.778 m)   Wt 167 lb 12.8 oz (76.1 kg)   SpO2 97%   BMI 24.08 kg/m?    ?Wt Readings from Last 3 Encounters:  ?05/07/21 167 lb 12.8 oz (76.1 kg)  ?04/29/21 166 lb 3.2 oz (75.4 kg)  ?01/15/21 169 lb 6.4 oz (76.8 kg)  ?  ?General: Well nourished, well developed, in no acute distress ?Head: Atraumatic, normal size  ?Eyes: PEERLA, EOMI  ?Neck: Supple, no JVD ?Endocrine: No thryomegaly ?Cardiac: Normal S1, S2; RRR; no murmurs, rubs, or gallops ?Lungs: Clear to auscultation bilaterally, no wheezing, rhonchi or rales  ?Abd: Soft, nontender, no hepatomegaly  ?Ext: No edema, pulses 2+ ?Musculoskeletal: No deformities, BUE and BLE strength normal and equal ?Skin: Warm and dry, no rashes   ?Neuro: Alert and oriented to person, place, time, and situation, CNII-XII grossly intact, no focal deficits  ?Psych: Normal mood and affect  ? ?ASSESSMENT:   ?[name] is a 76 y.o. male who presents for the following: ?1. NSVT (nonsustained ventricular tachycardia)   ?2. Coronary artery disease involving native coronary artery of native heart without angina pectoris   ?3. Mixed hyperlipidemia   ?4. Abnormal result of cardiovascular function study, unspecified   ?5. Agatston coronary artery calcium score greater than 400   ?6. Erectile dysfunction, unspecified erectile dysfunction type   ? ? ?PLAN:   ?1. NSVT (nonsustained ventricular tachycardia) ?2. Coronary artery disease involving native coronary artery of native heart without angina pectoris ?3. Mixed hyperlipidemia ?4.  Abnormal result of cardiovascular function study, unspecified ?5. Agatston coronary artery calcium score greater than 400 ?-Very abnormal stress test with 4 mm of ST depression.  Also had NSVT.  He request a noninvasive approach.  Seen by Dr. Stanford Breed who recommended coronary CTA as well as echocardiogram.  We can start here.  No alarming symptoms.  Does describe occasional chest tightness.  We need to make sure he does not have left main or three-vessel disease.  He will start aspirin 81 mg daily.  Also recommended  to start metoprolol succinate 25 mg daily.  BP slightly elevated but informed me it is well controlled at home.  It is reassuring his EKG remains normal.  Further recommendations will be made based on the results of his CT scan.  I did recommend high intensity statin.  He would like to hold on this for now.  Overall seems to be stable.  Further recommendation will be made based on CT scan and echo. ? ?6. Erectile dysfunction, unspecified erectile dysfunction type ?-He does request Viagra.  Medications prescribed. ? ?Disposition: Return if symptoms worsen or fail to improve. ? ?Medication Adjustments/Labs and Tests Ordered: ?Current medicines are reviewed at length with the patient today.  Concerns regarding medicines are outlined above.  ?Orders Placed This Encounter  ?Procedures  ? EKG 12-Lead  ? ?Meds ordered this encounter  ?Medications  ? sildenafil (VIAGRA) 25 MG tablet  ?  Sig: Take 1 tablet (25 mg total) by mouth daily as needed for erectile dysfunction.  ?  Dispense:  30 tablet  ?  Refill:  0  ? ? ?Patient Instructions  ?Medication Instructions:  ?Take Viagra 25 mg as needed.  ? ?*If you need a refill on your cardiac medications before your next appointment, please call your pharmacy* ? ?Follow-Up: ?At [facility], you and your health needs are our priority.  As part of our continuing mission to provide you with exceptional heart care, we have created designated Provider Care Teams.  These  Care Teams include your primary Cardiologist (physician) and Advanced Practice Providers (APPs -  Physician Assistants and Nurse Practitioners) who all work together to provide you with the care you need, when you ne

## 2021-05-07 ENCOUNTER — Other Ambulatory Visit: Payer: Self-pay

## 2021-05-07 ENCOUNTER — Encounter: Payer: Self-pay | Admitting: Cardiovascular Disease

## 2021-05-07 ENCOUNTER — Ambulatory Visit (INDEPENDENT_AMBULATORY_CARE_PROVIDER_SITE_OTHER): Payer: Medicare Other | Admitting: Cardiovascular Disease

## 2021-05-07 VITALS — BP 154/90 | HR 75 | Ht 70.0 in | Wt 167.8 lb

## 2021-05-07 DIAGNOSIS — E782 Mixed hyperlipidemia: Secondary | ICD-10-CM

## 2021-05-07 DIAGNOSIS — I2510 Atherosclerotic heart disease of native coronary artery without angina pectoris: Secondary | ICD-10-CM

## 2021-05-07 DIAGNOSIS — R9430 Abnormal result of cardiovascular function study, unspecified: Secondary | ICD-10-CM

## 2021-05-07 DIAGNOSIS — N529 Male erectile dysfunction, unspecified: Secondary | ICD-10-CM

## 2021-05-07 DIAGNOSIS — R931 Abnormal findings on diagnostic imaging of heart and coronary circulation: Secondary | ICD-10-CM

## 2021-05-07 DIAGNOSIS — I4729 Other ventricular tachycardia: Secondary | ICD-10-CM

## 2021-05-07 MED ORDER — SILDENAFIL CITRATE 25 MG PO TABS: 25.0000 mg | ORAL_TABLET | Freq: Every day | ORAL | 0 refills | Status: DC | PRN

## 2021-05-07 NOTE — Patient Instructions (Signed)
Medication Instructions:  ?Take Viagra 25 mg as needed.  ? ?*If you need a refill on your cardiac medications before your next appointment, please call your pharmacy* ? ?Follow-Up: ?At [facility], you and your health needs are our priority.  As part of our continuing mission to provide you with exceptional heart care, we have created designated Provider Care Teams.  These Care Teams include your primary Cardiologist (physician) and Advanced Practice Providers (APPs -  Physician Assistants and Nurse Practitioners) who all work together to provide you with the care you need, when you need it. ? ?We recommend signing up for the patient portal called "MyChart".  Sign up information is provided on this After Visit Summary.  MyChart is used to connect with patients for Virtual Visits (Telemedicine).  Patients are able to view lab/test results, encounter notes, upcoming appointments, etc.  Non-urgent messages can be sent to your provider as well.   ?To learn more about what you can do with MyChart, go to [email or web address].   ? ?Your next appointment:   ?Keep scheduled appointment. ? ? ? ?

## 2021-05-11 DIAGNOSIS — I4729 Other ventricular tachycardia: Secondary | ICD-10-CM | POA: Diagnosis not present

## 2021-05-12 ENCOUNTER — Telehealth (HOSPITAL_COMMUNITY): Payer: Self-pay | Admitting: *Deleted

## 2021-05-12 LAB — BASIC METABOLIC PANEL
BUN/Creatinine Ratio: 14 (ref 10–24)
BUN: 17 mg/dL (ref 8–27)
CO2: 24 mmol/L (ref 20–29)
Calcium: 9.8 mg/dL (ref 8.6–10.2)
Chloride: 99 mmol/L (ref 96–106)
Creatinine, Ser: 1.24 mg/dL (ref 0.76–1.27)
Glucose: 154 mg/dL — ABNORMAL HIGH (ref 70–99)
Potassium: 4.3 mmol/L (ref 3.5–5.2)
Sodium: 140 mmol/L (ref 134–144)
eGFR: 61 mL/min/{1.73_m2} (ref >59)

## 2021-05-12 NOTE — Telephone Encounter (Signed)
Attempted to call patient regarding upcoming cardiac CT appointment. °Left message on voicemail with name and callback number ° °Dakarai Mcglocklin RN Navigator Cardiac Imaging °[facility] °[phone] Office °[phone] Cell ° °

## 2021-05-13 ENCOUNTER — Ambulatory Visit (HOSPITAL_BASED_OUTPATIENT_CLINIC_OR_DEPARTMENT_OTHER): Payer: Medicare Other

## 2021-05-13 DIAGNOSIS — R9431 Abnormal electrocardiogram [ECG] [EKG]: Secondary | ICD-10-CM | POA: Diagnosis not present

## 2021-05-13 DIAGNOSIS — I4729 Other ventricular tachycardia: Secondary | ICD-10-CM | POA: Diagnosis not present

## 2021-05-13 DIAGNOSIS — R9430 Abnormal result of cardiovascular function study, unspecified: Secondary | ICD-10-CM | POA: Diagnosis not present

## 2021-05-13 LAB — ECHOCARDIOGRAM COMPLETE
Area-P 1/2: 2.94 cm2
P 1/2 time: 680 msec
S' Lateral: 2.4 cm

## 2021-05-14 ENCOUNTER — Ambulatory Visit (HOSPITAL_COMMUNITY)
Admission: RE | Admit: 2021-05-14 | Discharge: 2021-05-14 | Disposition: A | Discharge status: Home / Self Care | Payer: Medicare Other | Source: Ambulatory Visit | Attending: Cardiology | Admitting: Cardiology

## 2021-05-14 DIAGNOSIS — R9430 Abnormal result of cardiovascular function study, unspecified: Secondary | ICD-10-CM | POA: Diagnosis not present

## 2021-05-14 DIAGNOSIS — R9431 Abnormal electrocardiogram [ECG] [EKG]: Secondary | ICD-10-CM | POA: Diagnosis not present

## 2021-05-14 DIAGNOSIS — I2510 Atherosclerotic heart disease of native coronary artery without angina pectoris: Secondary | ICD-10-CM | POA: Diagnosis not present

## 2021-05-14 DIAGNOSIS — I4729 Other ventricular tachycardia: Secondary | ICD-10-CM

## 2021-05-14 MED ORDER — NITROGLYCERIN 0.4 MG SL SUBL
0.8000 mg | SUBLINGUAL_TABLET | Freq: Once | SUBLINGUAL | Status: AC
  Administered 2021-05-14: 0.8 mg via SUBLINGUAL

## 2021-05-14 MED ORDER — NITROGLYCERIN 0.4 MG SL SUBL
SUBLINGUAL_TABLET | SUBLINGUAL | Status: AC
  Filled 2021-05-14: qty 2

## 2021-05-14 MED ORDER — IOHEXOL 350 MG/ML SOLN
100.0000 mL | Freq: Once | INTRAVENOUS | Status: AC | PRN
  Administered 2021-05-14: 100 mL via INTRAVENOUS

## 2021-05-18 ENCOUNTER — Ambulatory Visit (INDEPENDENT_AMBULATORY_CARE_PROVIDER_SITE_OTHER): Payer: Medicare Other | Admitting: Family Medicine

## 2021-05-18 ENCOUNTER — Encounter: Payer: Self-pay | Admitting: Family Medicine

## 2021-05-18 VITALS — BP 134/80 | HR 64 | Temp 97.2°F | Wt 167.4 lb

## 2021-05-18 DIAGNOSIS — I2510 Atherosclerotic heart disease of native coronary artery without angina pectoris: Secondary | ICD-10-CM

## 2021-05-18 DIAGNOSIS — I700 Atherosclerosis of aorta: Secondary | ICD-10-CM | POA: Diagnosis not present

## 2021-05-18 DIAGNOSIS — N529 Male erectile dysfunction, unspecified: Secondary | ICD-10-CM | POA: Diagnosis not present

## 2021-05-18 DIAGNOSIS — R739 Hyperglycemia, unspecified: Secondary | ICD-10-CM | POA: Diagnosis not present

## 2021-05-18 LAB — POCT GLYCOSYLATED HEMOGLOBIN (HGB A1C): Hemoglobin A1C: 5.4 % (ref 4.0–5.6)

## 2021-05-18 MED ORDER — TADALAFIL 20 MG PO TABS: 20.0000 mg | ORAL_TABLET | Freq: Every day | ORAL | 1 refills | Status: DC | PRN

## 2021-05-18 NOTE — Progress Notes (Signed)
? ?  Subjective:  ? ? Patient ID: Jon Morales, male    DOB: 07/05/1945, 76 y.o.   MRN: [id number] ? ?HPI ?The 10-year ASCVD risk score (Arnett DK, et al., 2019) is: 21.3% ?  Values used to calculate the score: ?    Age: 10 years ?    Sex: Male ?    Is Non-Hispanic African American: No ?    Diabetic: No ?    Tobacco smoker: No ?    Systolic Blood Pressure: 846 mmHg ?    Is BP treated: No ?    HDL Cholesterol: 71 mg/dL ?    Total Cholesterol: 212 mg/dL  ?He is here for follow-up visit concerning recent blood work which did show blood sugar above 150.  Further history indicates that he actually had that blood work drawn after he had recently eaten a meal.  He has been seen by cardiology for evaluation of cardiovascular risk.  He does have a history of aortic atherosclerosis and has had a risk evaluation.  Statin was recommended however he is not interested. ?He is also had some difficulty with erectile dysfunction.  He was given a prescription for Cialis however it was quite expensive at his drugstore. ?Review of Systems ? ?   ?Objective:  ? Physical Exam ?Alert and in no distress.  Hemoglobin A1c is 5.4. ? ? ? ?   ?Assessment & Plan:  ?Elevated blood sugar - Plan: POCT glycosylated hemoglobin (Hb A1C) ? ?Atherosclerosis of aorta ([place]) ? ?Erectile dysfunction, unspecified erectile dysfunction type - Plan: tadalafil (CIALIS) 20 MG tablet ?I explained that he does not have diabetes.  I recommended that any blood drawn in the future to be done when he is fasting. ?I again discussed the diagnosis of aortic atherosclerosis and actually showed his cardiovascular risk score.  He is still not interested in a statin. ?I called in a prescription for Cialis to a different drugstore and recommended that he use [facility].  Discussed the proper use of it and possible side effects. ? ?

## 2021-05-28 DIAGNOSIS — Z961 Presence of intraocular lens: Secondary | ICD-10-CM | POA: Diagnosis not present

## 2021-05-28 DIAGNOSIS — H401131 Primary open-angle glaucoma, bilateral, mild stage: Secondary | ICD-10-CM | POA: Diagnosis not present

## 2021-05-28 DIAGNOSIS — H35343 Macular cyst, hole, or pseudohole, bilateral: Secondary | ICD-10-CM | POA: Diagnosis not present

## 2021-06-03 ENCOUNTER — Ambulatory Visit (INDEPENDENT_AMBULATORY_CARE_PROVIDER_SITE_OTHER): Payer: Medicare Other | Admitting: Dermatology

## 2021-06-03 ENCOUNTER — Encounter: Payer: Self-pay | Admitting: Dermatology

## 2021-06-03 DIAGNOSIS — Z1283 Encounter for screening for malignant neoplasm of skin: Secondary | ICD-10-CM | POA: Diagnosis not present

## 2021-06-03 DIAGNOSIS — D1801 Hemangioma of skin and subcutaneous tissue: Secondary | ICD-10-CM | POA: Diagnosis not present

## 2021-06-03 DIAGNOSIS — L814 Other melanin hyperpigmentation: Secondary | ICD-10-CM | POA: Diagnosis not present

## 2021-06-03 DIAGNOSIS — I2510 Atherosclerotic heart disease of native coronary artery without angina pectoris: Secondary | ICD-10-CM

## 2021-06-03 DIAGNOSIS — L570 Actinic keratosis: Secondary | ICD-10-CM | POA: Diagnosis not present

## 2021-06-03 DIAGNOSIS — Z85828 Personal history of other malignant neoplasm of skin: Secondary | ICD-10-CM | POA: Diagnosis not present

## 2021-06-03 DIAGNOSIS — L851 Acquired keratosis [keratoderma] palmaris et plantaris: Secondary | ICD-10-CM

## 2021-06-03 DIAGNOSIS — L821 Other seborrheic keratosis: Secondary | ICD-10-CM | POA: Diagnosis not present

## 2021-06-21 ENCOUNTER — Encounter: Payer: Self-pay | Admitting: Dermatology

## 2021-06-21 NOTE — Progress Notes (Signed)
? ?  Follow-Up Visit ?  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 Personal history of Bcc. ). ? ?General skin check, patient has noted several new spots ?Location:  ?Duration:  ?Quality:  ?Associated Signs/Symptoms: ?Modifying Factors:  ?Severity:  ?Timing: ?Context:  ? ?Objective  ?Well appearing patient in no apparent distress; mood and affect are within normal limits. ?Waist up exam: No atypical pigmented lesions (all checked with dermoscopy), no new or recurrent nonmelanoma skin cancer. ? ?Right Abdomen (side) - Upper ?Several 1 mm smooth red dermal papules ? ?Mid Supratip of Nose, Right Forehead, Right Superior Crus of Antihelix ?Gritty to hornlike 3 to 4 mm pink crusts ? ?Chest - Medial [facility]), Mid Back, Right Upper Arm - Posterior ?Textured brown 5 mm papules ? ?Left Forearm - Anterior ?Tan subtly textured symmetric 5 mm lesion, typical dermoscopy ? ?Left Hand - Posterior, Right Hand - Posterior ?3 mm monochrome light brown macules, no dermoscopic atypia ? ? ? ?All skin waist up examined. ? ? ?Assessment & Plan  ? ? ?Encounter for screening for malignant neoplasm of skin ? ?Annual skin examination, encouraged to self examine twice annually. ? ?Cherry angioma ?Right Abdomen (side) - Upper ? ?No intervention necessary ? ?AK (actinic keratosis) (3) ?Right Superior Crus of Antihelix; Mid Supratip of Nose; Right Forehead ? ?Destruction of lesion - Mid Supratip of Nose, Right Forehead, Right Superior Crus of Antihelix ?Complexity: simple   ?Destruction method: cryotherapy   ?Informed consent: discussed and consent obtained   ?Timeout:  patient name, date of birth, surgical site, and procedure verified ?Lesion destroyed using liquid nitrogen: Yes   ?Cryotherapy cycles:  3 ?Outcome: patient tolerated procedure well with no complications   ?Post-procedure details: wound care instructions given   ? ?Seborrheic keratosis (3) ?Right Upper  Arm - Posterior; Chest - Medial [facility]); Mid Back ? ?Leave if stable ? ?Stucco keratoses ?Left Forearm - Anterior ? ?Leave if stable ? ?Solar lentigo (2) ?Left Hand - Posterior; Right Hand - Posterior ? ?Check as needed change ? ? ? ? ? ?I, Lavonna Monarch, MD, have reviewed all documentation for this visit.  The documentation on 06/21/21 for the exam, diagnosis, procedures, and orders are all accurate and complete. ?

## 2021-06-30 ENCOUNTER — Telehealth: Payer: Self-pay | Admitting: Family Medicine

## 2021-06-30 NOTE — Telephone Encounter (Signed)
Left message for patient to call back and schedule Medicare Annual Wellness Visit (AWV) either virtually or in office. ?I left my number for patient to call [phone]. ? ?Last AWV ; 03/24/17 ?please schedule at anytime with health coach ? ?

## 2021-07-05 NOTE — Telephone Encounter (Signed)
Patient returned my call  He declined awv  did want to schedule

## 2021-07-07 ENCOUNTER — Encounter: Payer: Self-pay | Admitting: Family Medicine

## 2021-07-08 ENCOUNTER — Ambulatory Visit: Payer: Medicare Other | Admitting: Cardiovascular Disease

## 2021-07-15 ENCOUNTER — Ambulatory Visit: Payer: Medicare Other | Admitting: Cardiovascular Disease

## 2021-07-21 NOTE — Progress Notes (Signed)
Cardiology Office Note:   Date:  07/22/2021  NAME:  Jon Morales    MRN: [id number] DOB:  1945/08/17   PCP:  Denita Lung, MD  Cardiologist:  None  Electrophysiologist:  None   Referring MD: Denita Lung, MD   Chief Complaint  Patient presents with   Follow-up        History of Present Illness:   Jon Morales is a 76 y.o. male with a hx of non-obstructive CAD who presents for follow-up.  Coronary CTA with nonobstructive CAD.  Symptoms of chest discomfort likely musculoskeletal.  Can walk several miles without major limitations.  No shortness of breath.  LDL cholesterol slightly elevated.  He wishes to recheck this today.  Continues to desire a natural approach to his cholesterol-lowering therapies.  Blood pressure acceptable.  Exam normal.  Problem List 1. CAD -coronary calcium 403 (65th percentile) -CCTA mild 25-49% in the LAD/LCX 2. HLD -T chol 212, HDL 71, LDL 106, 205  Past Medical History: Past Medical History:  Diagnosis Date   Arthritis    Coronary artery disease    GERD (gastroesophageal reflux disease)    Lactose intolerance     Past Surgical History: Past Surgical History:  Procedure Laterality Date   COLONOSCOPY  2010   Dr. Oletta Lamas   HERNIA REPAIR  10/06/10   left inguinal hernia     Current Medications: Current Meds  Medication Sig   latanoprost (XALATAN) 0.005 % ophthalmic solution    metoprolol succinate (TOPROL XL) 25 MG 24 hr tablet Take 1 tablet (25 mg total) by mouth daily.   tadalafil (CIALIS) 20 MG tablet Take 1 tablet (20 mg total) by mouth daily as needed for erectile dysfunction.     Allergies:    Levaquin [levofloxacin in d5w] and Sulfa drugs cross reactors   Social History: Social History   Socioeconomic History   Marital status: Married    Spouse name: Not on file   Number of children: 3   Years of education: Not on file   Highest education level: Not on file  Occupational History   Occupation: Chiropractor  Tobacco Use    Smoking status: Never   Smokeless tobacco: Never  Vaping Use   Vaping Use: Never used  Substance and Sexual Activity   Alcohol use: Yes    Alcohol/week: 4.0 standard drinks of alcohol    Types: 4 Cans of beer per week   Drug use: No   Sexual activity: Yes  Other Topics Concern   Not on file  Social History Narrative   Not on file   Social Determinants of Health   Financial Resource Strain: Not on file  Food Insecurity: Not on file  Transportation Needs: Not on file  Physical Activity: Not on file  Stress: Not on file  Social Connections: Not on file     Family History: The patient's family history includes Heart disease in his brother; Stroke in his father.  ROS:   All other ROS reviewed and negative. Pertinent positives noted in the HPI.     EKGs/Labs/Other Studies Reviewed:   The following studies were personally reviewed by me today:  TTE 05/13/2021  1. Left ventricular ejection fraction, by estimation, is 60 to 65%. The  left ventricle has normal function. The left ventricle has no regional  wall motion abnormalities. Left ventricular diastolic parameters were  normal.   2. Right ventricular systolic function is normal. The right ventricular  size is normal. There is normal  pulmonary artery systolic pressure. The  estimated right ventricular systolic pressure is 99.3 mmHg.   3. The mitral valve is normal in structure. Trivial mitral valve  regurgitation. No evidence of mitral stenosis.   4. The aortic valve is tricuspid. There is mild calcification of the  aortic valve. There is mild thickening of the aortic valve. Aortic valve  regurgitation is mild. Aortic valve sclerosis is present, with no evidence  of aortic valve stenosis.   5. The inferior vena cava is normal in size with greater than 50%  respiratory variability, suggesting right atrial pressure of 3 mmHg.   Recent Labs: 01/15/2021: ALT 45 05/11/2021: BUN 17; Creatinine, Ser 1.24; Potassium 4.3; Sodium  140   Recent Lipid Panel    Component Value Date/Time   CHOL 212 (H) 03/03/2020 1446   TRIG 205 (H) 03/03/2020 1446   HDL 71 03/03/2020 1446   CHOLHDL 3.0 03/03/2020 1446   CHOLHDL 2.9 11/02/2012 0857   VLDL 21 11/02/2012 0857   LDLCALC 106 (H) 03/03/2020 1446    Physical Exam:   VS:  BP 126/81   Pulse 64   Ht '5\' 10"'$  (1.778 m)   Wt 169 lb 3.2 oz (76.7 kg)   SpO2 94%   BMI 24.28 kg/m    Wt Readings from Last 3 Encounters:  07/22/21 169 lb 3.2 oz (76.7 kg)  05/18/21 167 lb 6.4 oz (75.9 kg)  05/07/21 167 lb 12.8 oz (76.1 kg)    General: Well nourished, well developed, in no acute distress Head: Atraumatic, normal size  Eyes: PEERLA, EOMI  Neck: Supple, no JVD Endocrine: No thryomegaly Cardiac: Normal S1, S2; RRR; no murmurs, rubs, or gallops Lungs: Clear to auscultation bilaterally, no wheezing, rhonchi or rales  Abd: Soft, nontender, no hepatomegaly  Ext: No edema, pulses 2+ Musculoskeletal: No deformities, BUE and BLE strength normal and equal Skin: Warm and dry, no rashes   Neuro: Alert and oriented to person, place, time, and situation, CNII-XII grossly intact, no focal deficits  Psych: Normal mood and affect   ASSESSMENT:   Jon Morales is a 76 y.o. male who presents for the following: 1. Coronary artery disease involving native coronary artery of native heart without angina pectoris   2. Mixed hyperlipidemia     PLAN:   1. Coronary artery disease involving native coronary artery of native heart without angina pectoris 2. Mixed hyperlipidemia -Had an abnormal exercise treadmill stress test.  High risk findings.  Had NSVT as well.  Echo normal.  Coronary CTA with nonobstructive CAD.  Suspect this was a false positive.  He describes no symptoms of angina.  Does describe some tightness in his chest but this is likely musculoskeletal as he works as a Restaurant manager, fast food.  He is able to walk several miles without any concerning symptoms of angina.  He is on aspirin 81 mg  daily.  On metoprolol succinate 25 mg daily.  We discussed going on statin.  For now he would like to remain on a natural regimen.  This is okay.  We will recheck lipids today.  He will see me yearly.  Disposition: Return in about 1 year (around 07/23/2022).  Medication Adjustments/Labs and Tests Ordered: Current medicines are reviewed at length with the patient today.  Concerns regarding medicines are outlined above.  Orders Placed This Encounter  Procedures   Lipid panel   No orders of the defined types were placed in this encounter.   Patient Instructions  Medication Instructions:  The current medical  regimen is effective;  continue present plan and medications.  *If you need a refill on your cardiac medications before your next appointment, please call your pharmacy*   Lab Work: LIPID today   If you have labs (blood work) drawn today and your tests are completely normal, you will receive your results only by: [place] (if you have MyChart) OR A paper copy in the mail If you have any lab test that is abnormal or we need to change your treatment, we will call you to review the results.   Follow-Up: At [facility], you and your health needs are our priority.  As part of our continuing mission to provide you with exceptional heart care, we have created designated Provider Care Teams.  These Care Teams include your primary Cardiologist (physician) and Advanced Practice Providers (APPs -  Physician Assistants and Nurse Practitioners) who all work together to provide you with the care you need, when you need it.  We recommend signing up for the patient portal called "MyChart".  Sign up information is provided on this After Visit Summary.  MyChart is used to connect with patients for Virtual Visits (Telemedicine).  Patients are able to view lab/test results, encounter notes, upcoming appointments, etc.  Non-urgent messages can be sent to your provider as well.   To learn more  about what you can do with MyChart, go to [email or web address].    Your next appointment:   12 month(s)  The format for your next appointment:   In Person  Provider:   Eleonore Chiquito, MD            Time Spent with Patient: I have spent a total of 25 minutes with patient reviewing hospital notes, telemetry, EKGs, labs and examining the patient as well as establishing an assessment and plan that was discussed with the patient.  > 50% of time was spent in direct patient care.  Signed, Addison Naegeli. Audie Box, MD, [place]  [address], [place], [place] [address] [phone]  07/22/2021 9:34 AM

## 2021-07-22 ENCOUNTER — Encounter: Payer: Self-pay | Admitting: Cardiovascular Disease

## 2021-07-22 ENCOUNTER — Ambulatory Visit (INDEPENDENT_AMBULATORY_CARE_PROVIDER_SITE_OTHER): Payer: Medicare Other | Admitting: Cardiovascular Disease

## 2021-07-22 VITALS — BP 126/81 | HR 64 | Ht 70.0 in | Wt 169.2 lb

## 2021-07-22 DIAGNOSIS — E782 Mixed hyperlipidemia: Secondary | ICD-10-CM

## 2021-07-22 DIAGNOSIS — I2510 Atherosclerotic heart disease of native coronary artery without angina pectoris: Secondary | ICD-10-CM | POA: Diagnosis not present

## 2021-07-22 LAB — LIPID PANEL
Chol/HDL Ratio: 2.5 ratio (ref 0.0–5.0)
Cholesterol, Total: 180 mg/dL (ref 100–199)
HDL: 71 mg/dL (ref >39)
LDL Chol Calc (NIH): 94 mg/dL (ref 0–99)
Triglycerides: 79 mg/dL (ref 0–149)
VLDL Cholesterol Cal: 15 mg/dL (ref 5–40)

## 2021-07-22 NOTE — Patient Instructions (Addendum)
Medication Instructions:  The current medical regimen is effective;  continue present plan and medications.  *If you need a refill on your cardiac medications before your next appointment, please call your pharmacy*   Lab Work: LIPID today   If you have labs (blood work) drawn today and your tests are completely normal, you will receive your results only by: [place] (if you have MyChart) OR A paper copy in the mail If you have any lab test that is abnormal or we need to change your treatment, we will call you to review the results.   Follow-Up: At [facility], you and your health needs are our priority.  As part of our continuing mission to provide you with exceptional heart care, we have created designated Provider Care Teams.  These Care Teams include your primary Cardiologist (physician) and Advanced Practice Providers (APPs -  Physician Assistants and Nurse Practitioners) who all work together to provide you with the care you need, when you need it.  We recommend signing up for the patient portal called "MyChart".  Sign up information is provided on this After Visit Summary.  MyChart is used to connect with patients for Virtual Visits (Telemedicine).  Patients are able to view lab/test results, encounter notes, upcoming appointments, etc.  Non-urgent messages can be sent to your provider as well.   To learn more about what you can do with MyChart, go to [email or web address].    Your next appointment:   12 month(s)  The format for your next appointment:   In Person  Provider:   Eleonore Chiquito, MD

## 2021-07-29 ENCOUNTER — Encounter: Payer: Self-pay | Admitting: Dermatology

## 2021-07-29 ENCOUNTER — Ambulatory Visit (INDEPENDENT_AMBULATORY_CARE_PROVIDER_SITE_OTHER): Payer: Medicare Other | Admitting: Dermatology

## 2021-07-29 DIAGNOSIS — C44222 Squamous cell carcinoma of skin of right ear and external auricular canal: Secondary | ICD-10-CM | POA: Diagnosis not present

## 2021-07-29 DIAGNOSIS — L570 Actinic keratosis: Secondary | ICD-10-CM | POA: Diagnosis not present

## 2021-07-29 DIAGNOSIS — D485 Neoplasm of uncertain behavior of skin: Secondary | ICD-10-CM

## 2021-08-19 ENCOUNTER — Ambulatory Visit: Payer: Medicare Other | Admitting: Family Medicine

## 2021-08-22 ENCOUNTER — Encounter: Payer: Self-pay | Admitting: Dermatology

## 2021-08-22 NOTE — Progress Notes (Signed)
   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  Persistent crust at site of LN2 freeze ear, several other crusts Location:  Duration:  Quality:  Associated Signs/Symptoms: Modifying Factors:  Severity:  Timing: Context:   Objective  Well appearing patient in no apparent distress; mood and affect are within normal limits. Dorsum of Nose, Left Superior Helix Gritty to hornlike 3 mm pink crusts  Right Superior Helix 5 mm crust with focal erosion, superficial carcinoma         A focused examination was performed including head and neck. Relevant physical exam findings are noted in the Assessment and Plan.   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  Skin / nail biopsy Type of biopsy: tangential   Informed consent: discussed and consent obtained   Timeout: patient name, date of birth, surgical site, and procedure verified   Procedure prep:  Patient was prepped and draped in usual sterile fashion (Non sterile) Prep type:  Chlorhexidine Anesthesia: the lesion was anesthetized in a standard fashion   Anesthetic:  1% lidocaine w/ epinephrine 1-100,000 local infiltration Instrument used: flexible razor blade   Outcome: patient tolerated procedure well   Post-procedure details: wound care instructions given    Destruction of lesion Complexity: simple    Destruction method: electrodesiccation and curettage   Informed consent: discussed and consent obtained   Timeout:  patient name, date of birth, surgical site, and procedure verified Anesthesia: the lesion was anesthetized in a standard fashion   Anesthetic:  1% lidocaine w/ epinephrine 1-100,000 local infiltration Curettage performed in three different directions: Yes   Curettage cycles:  3 Lesion length (cm):  1 Lesion width (cm):  1 Margin per side (cm):  0 Final wound size (cm):  1 Hemostasis achieved with:  aluminum chloride Outcome: patient tolerated procedure well with no complications   Post-procedure details: wound care instructions given    Specimen 1 - Surgical pathology Differential Diagnosis: bcc vs [id number]  Check Margins: No  After shave biopsy the base of the lesion was treated with curettage plus cautery      I, Lavonna Monarch, MD, have reviewed all documentation for this visit.  The documentation on 08/22/21 for the exam, diagnosis, procedures, and orders are all accurate and complete.

## 2021-09-24 DIAGNOSIS — H35343 Macular cyst, hole, or pseudohole, bilateral: Secondary | ICD-10-CM | POA: Diagnosis not present

## 2021-09-24 DIAGNOSIS — H401131 Primary open-angle glaucoma, bilateral, mild stage: Secondary | ICD-10-CM | POA: Diagnosis not present

## 2021-09-24 DIAGNOSIS — Z961 Presence of intraocular lens: Secondary | ICD-10-CM | POA: Diagnosis not present

## 2021-10-20 ENCOUNTER — Encounter: Payer: Self-pay | Admitting: Internal Medicine

## 2021-11-05 ENCOUNTER — Ambulatory Visit: Payer: Medicare Other | Admitting: Family Medicine

## 2021-12-06 ENCOUNTER — Encounter: Payer: Self-pay | Admitting: Internal Medicine

## 2021-12-10 ENCOUNTER — Ambulatory Visit (INDEPENDENT_AMBULATORY_CARE_PROVIDER_SITE_OTHER): Payer: Medicare Other | Admitting: Family Medicine

## 2021-12-10 ENCOUNTER — Encounter: Payer: Self-pay | Admitting: Family Medicine

## 2021-12-10 VITALS — BP 130/80 | HR 80 | Temp 98.5°F | Ht 69.75 in | Wt 166.8 lb

## 2021-12-10 DIAGNOSIS — I700 Atherosclerosis of aorta: Secondary | ICD-10-CM | POA: Diagnosis not present

## 2021-12-10 DIAGNOSIS — K648 Other hemorrhoids: Secondary | ICD-10-CM

## 2021-12-10 DIAGNOSIS — I7121 Aneurysm of the ascending aorta, without rupture: Secondary | ICD-10-CM | POA: Insufficient documentation

## 2021-12-10 DIAGNOSIS — K5730 Diverticulosis of large intestine without perforation or abscess without bleeding: Secondary | ICD-10-CM

## 2021-12-10 DIAGNOSIS — Z2821 Immunization not carried out because of patient refusal: Secondary | ICD-10-CM | POA: Diagnosis not present

## 2021-12-10 DIAGNOSIS — Z8719 Personal history of other diseases of the digestive system: Secondary | ICD-10-CM

## 2021-12-10 DIAGNOSIS — E785 Hyperlipidemia, unspecified: Secondary | ICD-10-CM

## 2021-12-10 DIAGNOSIS — K449 Diaphragmatic hernia without obstruction or gangrene: Secondary | ICD-10-CM

## 2021-12-10 DIAGNOSIS — I4729 Other ventricular tachycardia: Secondary | ICD-10-CM | POA: Diagnosis not present

## 2021-12-10 DIAGNOSIS — Z9889 Other specified postprocedural states: Secondary | ICD-10-CM | POA: Diagnosis not present

## 2021-12-10 DIAGNOSIS — N529 Male erectile dysfunction, unspecified: Secondary | ICD-10-CM

## 2021-12-10 DIAGNOSIS — Z0000 Encounter for general adult medical examination without abnormal findings: Secondary | ICD-10-CM

## 2021-12-10 LAB — POCT URINALYSIS DIP (CLINITEK)
Bilirubin, UA: NEGATIVE
Blood, UA: NEGATIVE
Glucose, UA: NEGATIVE mg/dL
Ketones, POC UA: NEGATIVE mg/dL
Leukocytes, UA: NEGATIVE
Nitrite, UA: NEGATIVE
POC PROTEIN,UA: NEGATIVE
Spec Grav, UA: 1.01 (ref 1.010–1.025)
Urobilinogen, UA: 0.2 E.U./dL
pH, UA: 6 (ref 5.0–8.0)

## 2021-12-10 MED ORDER — METOPROLOL SUCCINATE ER 25 MG PO TB24: 25.0000 mg | ORAL_TABLET | Freq: Every day | ORAL | 3 refills | Status: DC

## 2021-12-10 NOTE — Progress Notes (Signed)
Jon Morales is a 76 y.o. male who presents for annual wellness visit,CPE and follow-up on chronic medical conditions.  He is doing well.  He does have hyperlipidemia as well as aortic atherosclerosis but is not interested in statins.  He does have a hiatus hernia but is not causing a great deal of difficulty.  He has had inguinal hernia repair.  He has had a colonoscopy which did show internal hemorrhoids as well as diverticulosis.  He does have ED and has Cialis available on an as-needed basis.  Immunizations were reviewed and he is not interested in having any immunization.  His home life is going well.  He continues to work and is considering retirement.  Immunizations and Health Maintenance  There is no immunization history on file for this patient. Health Maintenance Due  Topic Date Due   INFLUENZA VACCINE  Never done    Last colonoscopy: 2022 Last PSA: 2021 Dentist: Dr. Eliberto Ivory Ophtho: Dr. Katy Fitch  Exercise: home and gym walking and light weights  Other doctors caring for patient include: Cardiologist Dr. Marisue Ivan, derm Dr. Salome Holmes, GI Thana Farr  Advanced Directives: Does Patient Have a Medical Advance Directive?: No Would patient like information on creating a medical advance directive?: Yes (ED - Information included in AVS)  Depression screen:  See questionnaire below.        12/10/2021   10:50 AM 01/15/2021    2:41 PM 01/14/2020    3:33 PM 12/14/2017    2:58 PM 03/24/2017    8:40 AM  Depression screen PHQ 2/9  Decreased Interest 0 0 0 0   Down, Depressed, Hopeless 0 0 0 0 0  PHQ - 2 Score 0 0 0 0 0    Fall Screen: See Questionaire below.      12/10/2021   10:50 AM 01/15/2021    2:41 PM 12/14/2017    2:58 PM 03/24/2017    8:40 AM 11/02/2012    8:25 AM  Fall Risk   Falls in the past year? 0 0 No No No  Number falls in past yr: 0 0     Injury with Fall? 0 0     Risk for fall due to : No Fall Risks No Fall Risks     Follow up Falls evaluation completed Falls evaluation  completed       ADL screen:  See questionnaire below.  Functional Status Survey: Is the patient deaf or have difficulty hearing?: No Does the patient have difficulty seeing, even when wearing glasses/contacts?: No Does the patient have difficulty concentrating, remembering, or making decisions?: No Does the patient have difficulty walking or climbing stairs?: No Does the patient have difficulty dressing or bathing?: No Does the patient have difficulty doing errands alone such as visiting a doctor's office or shopping?: No   Review of Systems  Constitutional: -, -unexpected weight change, -anorexia, -fatigue Allergy: -sneezing, -itching, -congestion Dermatology: denies changing moles, rash, lumps ENT: -runny nose, -ear pain, -sore throat,  Cardiology:  -chest pain, -palpitations, -orthopnea, Respiratory: -cough, -shortness of breath, -dyspnea on exertion, -wheezing,  Gastroenterology: -abdominal pain, -nausea, -vomiting, -diarrhea, -constipation, -dysphagia Hematology: -bleeding or bruising problems Musculoskeletal: -arthralgias, -myalgias, -joint swelling, -back pain, - Ophthalmology: -vision changes,  Urology: -dysuria, -difficulty urinating,  -urinary frequency, -urgency, incontinence Neurology: -, -numbness, , -memory loss, -falls, -dizziness    PHYSICAL EXAM:  BP 130/80   Pulse 80   Temp 98.5 F (36.9 C)   Ht 5' 9.75" (1.772 m)   Wt 166 lb 12.8  oz (75.7 kg)   BMI 24.11 kg/m   General Appearance: Alert, cooperative, no distress, appears stated age Head: Normocephalic, without obvious abnormality, atraumatic Eyes: PERRL, conjunctiva/corneas clear, EOM's intact,  Ears: Normal TM's and external ear canals Nose: Nares normal, mucosa normal, no drainage or sinus   tenderness Throat: Lips, mucosa, and tongue normal; teeth and gums normal Neck: Supple, no lymphadenopathy, thyroid:no enlargement/tenderness/nodules; no carotid bruit or JVD Lungs: Clear to auscultation  bilaterally without wheezes, rales or ronchi; respirations unlabored Heart: Regular rate and rhythm, S1 and S2 normal, no murmur, rub or gallop Abdomen: Soft, non-tender, nondistended, normoactive bowel sounds, no masses, no hepatosplenomegaly Skin: Skin color, texture, turgor normal, no rashes or lesions Lymph nodes: Cervical, supraclavicular, and axillary nodes normal Neurologic: CNII-XII intact, normal strength, sensation and gait; reflexes 2+ and symmetric throughout   Psych: Normal mood, affect, hygiene and grooming  ASSESSMENT/PLAN: Routine general medical examination at a health care facility - Plan: CBC with Differential/Platelet, Comprehensive metabolic panel, POCT URINALYSIS DIP (CLINITEK), CANCELED: Urinalysis Dipstick  Atherosclerosis of aorta (HCC) - Plan: Lipid panel  Internal hemorrhoids  Hiatal hernia  Diverticulosis of colon - Plan: CBC with Differential/Platelet, Comprehensive metabolic panel  Erectile dysfunction, unspecified erectile dysfunction type  Hyperlipidemia with target low density lipoprotein (LDL) cholesterol less than 130 mg/dL - Plan: Lipid panel  NSVT (nonsustained ventricular tachycardia) (HCC) - Plan: CBC with Differential/Platelet, Comprehensive metabolic panel, metoprolol succinate (TOPROL XL) 25 MG 24 hr tablet, DISCONTINUED: metoprolol succinate (TOPROL XL) 25 MG 24 hr tablet  S/P hernia repair  Immunization refused  Aneurysm of ascending aorta without rupture (HCC)   I will follow-up with patient in the ascending aortic aneurysm.  Continue on metoprolol and use Cialis as needed. Discussed at least 30 minutes of aerobic activity at least 5 days/week; p I  Colonoscopy recommendations reviewed.   Medicare Attestation I have personally reviewed: The patient's medical and social history Their use of alcohol, tobacco or illicit drugs Their current medications and supplements The patient's functional ability including ADLs,fall risks, home  safety risks, cognitive, and hearing and visual impairment Diet and physical activities Evidence for depression or mood disorders  The patient's weight, height, and BMI have been recorded in the chart.  I have made referrals, counseling, and provided education to the patient based on review of the above and I have provided the patient with a written personalized care plan for preventive services.     Jill Alexanders, MD   12/10/2021

## 2021-12-11 LAB — CBC WITH DIFFERENTIAL/PLATELET
Basophils Absolute: 0.1 10*3/uL (ref 0.0–0.2)
Basos: 1 %
EOS (ABSOLUTE): 0.1 10*3/uL (ref 0.0–0.4)
Eos: 2 %
Hematocrit: 48.9 % (ref 37.5–51.0)
Hemoglobin: 16 g/dL (ref 13.0–17.7)
Immature Grans (Abs): 0 10*3/uL (ref 0.0–0.1)
Immature Granulocytes: 1 %
Lymphocytes Absolute: 2.3 10*3/uL (ref 0.7–3.1)
Lymphs: 35 %
MCH: 30 pg (ref 26.6–33.0)
MCHC: 32.7 g/dL (ref 31.5–35.7)
MCV: 92 fL (ref 79–97)
Monocytes Absolute: 0.8 10*3/uL (ref 0.1–0.9)
Monocytes: 12 %
Neutrophils Absolute: 3.2 10*3/uL (ref 1.4–7.0)
Neutrophils: 49 %
Platelets: 263 10*3/uL (ref 150–450)
RBC: 5.34 x10E6/uL (ref 4.14–5.80)
RDW: 12.2 % (ref 11.6–15.4)
WBC: 6.5 10*3/uL (ref 3.4–10.8)

## 2021-12-11 LAB — COMPREHENSIVE METABOLIC PANEL
ALT: 45 IU/L — ABNORMAL HIGH (ref 0–44)
AST: 35 IU/L (ref 0–40)
Albumin/Globulin Ratio: 2 (ref 1.2–2.2)
Albumin: 5.1 g/dL — ABNORMAL HIGH (ref 3.8–4.8)
Alkaline Phosphatase: 52 IU/L (ref 44–121)
BUN/Creatinine Ratio: 12 (ref 10–24)
BUN: 14 mg/dL (ref 8–27)
Bilirubin Total: 0.6 mg/dL (ref 0.0–1.2)
CO2: 23 mmol/L (ref 20–29)
Calcium: 10.1 mg/dL (ref 8.6–10.2)
Chloride: 101 mmol/L (ref 96–106)
Creatinine, Ser: 1.19 mg/dL (ref 0.76–1.27)
Globulin, Total: 2.6 g/dL (ref 1.5–4.5)
Glucose: 100 mg/dL — ABNORMAL HIGH (ref 70–99)
Potassium: 4.9 mmol/L (ref 3.5–5.2)
Sodium: 142 mmol/L (ref 134–144)
Total Protein: 7.7 g/dL (ref 6.0–8.5)
eGFR: 64 mL/min/{1.73_m2} (ref >59)

## 2021-12-11 LAB — LIPID PANEL
Chol/HDL Ratio: 3 ratio (ref 0.0–5.0)
Cholesterol, Total: 225 mg/dL — ABNORMAL HIGH (ref 100–199)
HDL: 75 mg/dL (ref >39)
LDL Chol Calc (NIH): 131 mg/dL — ABNORMAL HIGH (ref 0–99)
Triglycerides: 111 mg/dL (ref 0–149)
VLDL Cholesterol Cal: 19 mg/dL (ref 5–40)

## 2021-12-14 ENCOUNTER — Telehealth: Payer: Self-pay | Admitting: Family Medicine

## 2022-02-04 DIAGNOSIS — Z961 Presence of intraocular lens: Secondary | ICD-10-CM | POA: Diagnosis not present

## 2022-02-04 DIAGNOSIS — H401131 Primary open-angle glaucoma, bilateral, mild stage: Secondary | ICD-10-CM | POA: Diagnosis not present

## 2022-02-04 DIAGNOSIS — H35343 Macular cyst, hole, or pseudohole, bilateral: Secondary | ICD-10-CM | POA: Diagnosis not present

## 2022-02-23 ENCOUNTER — Encounter: Payer: Self-pay | Admitting: Internal Medicine

## 2022-03-17 ENCOUNTER — Encounter: Payer: Self-pay | Admitting: Family Medicine

## 2022-03-17 ENCOUNTER — Ambulatory Visit (INDEPENDENT_AMBULATORY_CARE_PROVIDER_SITE_OTHER): Payer: Medicare Other | Admitting: Family Medicine

## 2022-03-17 ENCOUNTER — Ambulatory Visit
Admission: RE | Admit: 2022-03-17 | Discharge: 2022-03-17 | Disposition: A | Discharge status: Home / Self Care | Payer: Medicare Other | Source: Ambulatory Visit | Attending: Family Medicine | Admitting: Family Medicine

## 2022-03-17 VITALS — BP 146/76 | HR 80 | Temp 97.6°F | Resp 18 | Wt 174.0 lb

## 2022-03-17 DIAGNOSIS — M546 Pain in thoracic spine: Secondary | ICD-10-CM | POA: Diagnosis not present

## 2022-03-17 DIAGNOSIS — M549 Dorsalgia, unspecified: Secondary | ICD-10-CM

## 2022-03-17 DIAGNOSIS — M545 Low back pain, unspecified: Secondary | ICD-10-CM | POA: Diagnosis not present

## 2022-03-17 LAB — POCT URINALYSIS DIP (PROADVANTAGE DEVICE)
Bilirubin, UA: NEGATIVE
Blood, UA: NEGATIVE
Glucose, UA: NEGATIVE mg/dL
Ketones, POC UA: NEGATIVE mg/dL
Leukocytes, UA: NEGATIVE
Nitrite, UA: NEGATIVE
Protein Ur, POC: NEGATIVE mg/dL
Specific Gravity, Urine: 1.01
Urobilinogen, Ur: 0.2
pH, UA: 7 (ref 5.0–8.0)

## 2022-03-17 NOTE — Progress Notes (Signed)
   Subjective:    Patient ID: Jon Morales, male    DOB: 11-18-1945, 77 y.o.   MRN: [id number]  HPI He complains of difficulty with intermittent upper back pain that can sometimes last 5 minutes.  It is not associated with motion, nausea, vomiting, difficulty breathing.  He does complain of some tightness in that area but otherwise has had no trouble.  He has not had any x-rays on that area in quite some time.  No history of referred pain.   Review of Systems     Objective:   Physical Exam Alert and in no distress.  Visual exam of his upper back shows no lesions.  Normal motion of the back.  No palpable tenderness.       Assessment & Plan:  Upper back pain on left side - Plan: DG Chest 2 View, DG Thoracic Spine W/Swimmers  Left-sided low back pain without sciatica, unspecified chronicity - Plan: POCT Urinalysis DIP (Proadvantage Device) I explained that since it does tend to go away relatively quickly probably were not dealing with any major issue but I think x-rays to rule out any underlying problem is reasonable.

## 2022-03-21 ENCOUNTER — Telehealth: Payer: Self-pay | Admitting: Family Medicine

## 2022-03-21 DIAGNOSIS — N529 Male erectile dysfunction, unspecified: Secondary | ICD-10-CM

## 2022-03-21 MED ORDER — TADALAFIL 20 MG PO TABS: 20.0000 mg | ORAL_TABLET | Freq: Every day | ORAL | 1 refills | Status: DC | PRN

## 2022-03-21 NOTE — Telephone Encounter (Signed)
Pharmacy sent refill request for tadalafil 20 mg Please send to the [facility] home delivery service

## 2022-03-30 ENCOUNTER — Other Ambulatory Visit: Payer: Self-pay

## 2022-03-30 DIAGNOSIS — I4729 Other ventricular tachycardia: Secondary | ICD-10-CM

## 2022-03-30 MED ORDER — METOPROLOL SUCCINATE ER 25 MG PO TB24: 25.0000 mg | ORAL_TABLET | Freq: Every day | ORAL | 1 refills | Status: DC

## 2022-04-28 ENCOUNTER — Telehealth: Payer: Self-pay | Admitting: Family Medicine

## 2022-04-28 NOTE — Telephone Encounter (Signed)
Mikki Santee called and he feels he has a sinus infection and he is asking if you can send him in something. I did try to schedule an appointment but since you are booked today and not here tomorrow he asked if I can just send a message back.

## 2022-04-28 NOTE — Telephone Encounter (Signed)
Tried calling patient. Left message to call back. 

## 2022-04-29 NOTE — Telephone Encounter (Signed)
Pt was notified.  

## 2022-06-10 DIAGNOSIS — H401131 Primary open-angle glaucoma, bilateral, mild stage: Secondary | ICD-10-CM | POA: Diagnosis not present

## 2022-10-21 DIAGNOSIS — H04123 Dry eye syndrome of bilateral lacrimal glands: Secondary | ICD-10-CM | POA: Diagnosis not present

## 2022-10-21 DIAGNOSIS — H35343 Macular cyst, hole, or pseudohole, bilateral: Secondary | ICD-10-CM | POA: Diagnosis not present

## 2022-10-21 DIAGNOSIS — H401131 Primary open-angle glaucoma, bilateral, mild stage: Secondary | ICD-10-CM | POA: Diagnosis not present

## 2022-10-21 DIAGNOSIS — Z961 Presence of intraocular lens: Secondary | ICD-10-CM | POA: Diagnosis not present

## 2022-12-29 ENCOUNTER — Ambulatory Visit: Payer: Medicare Other | Admitting: Family Medicine

## 2023-01-26 ENCOUNTER — Ambulatory Visit: Payer: Medicare Other | Admitting: Family Medicine

## 2023-03-01 ENCOUNTER — Other Ambulatory Visit: Payer: Self-pay | Admitting: Family Medicine

## 2023-03-01 DIAGNOSIS — N529 Male erectile dysfunction, unspecified: Secondary | ICD-10-CM

## 2023-03-10 ENCOUNTER — Other Ambulatory Visit: Payer: Self-pay | Admitting: Cardiology

## 2023-03-10 DIAGNOSIS — I4729 Other ventricular tachycardia: Secondary | ICD-10-CM

## 2023-03-12 DIAGNOSIS — J111 Influenza due to unidentified influenza virus with other respiratory manifestations: Secondary | ICD-10-CM | POA: Diagnosis not present

## 2023-03-12 DIAGNOSIS — R059 Cough, unspecified: Secondary | ICD-10-CM | POA: Diagnosis not present

## 2023-03-24 DIAGNOSIS — Z961 Presence of intraocular lens: Secondary | ICD-10-CM | POA: Diagnosis not present

## 2023-03-24 DIAGNOSIS — H401131 Primary open-angle glaucoma, bilateral, mild stage: Secondary | ICD-10-CM | POA: Diagnosis not present

## 2023-03-24 DIAGNOSIS — H04123 Dry eye syndrome of bilateral lacrimal glands: Secondary | ICD-10-CM | POA: Diagnosis not present

## 2023-03-24 DIAGNOSIS — H35343 Macular cyst, hole, or pseudohole, bilateral: Secondary | ICD-10-CM | POA: Diagnosis not present

## 2023-04-11 ENCOUNTER — Encounter: Payer: Self-pay | Admitting: Internal Medicine

## 2023-04-26 DIAGNOSIS — I2510 Atherosclerotic heart disease of native coronary artery without angina pectoris: Secondary | ICD-10-CM | POA: Insufficient documentation

## 2023-04-26 DIAGNOSIS — H4010X0 Unspecified open-angle glaucoma, stage unspecified: Secondary | ICD-10-CM | POA: Insufficient documentation

## 2023-04-27 ENCOUNTER — Ambulatory Visit (INDEPENDENT_AMBULATORY_CARE_PROVIDER_SITE_OTHER): Payer: Medicare Other | Admitting: Family Medicine

## 2023-04-27 ENCOUNTER — Encounter: Payer: Self-pay | Admitting: Family Medicine

## 2023-04-27 VITALS — BP 120/70 | HR 75 | Ht 70.0 in | Wt 170.2 lb

## 2023-04-27 DIAGNOSIS — I700 Atherosclerosis of aorta: Secondary | ICD-10-CM | POA: Diagnosis not present

## 2023-04-27 DIAGNOSIS — I2510 Atherosclerotic heart disease of native coronary artery without angina pectoris: Secondary | ICD-10-CM

## 2023-04-27 DIAGNOSIS — H4010X0 Unspecified open-angle glaucoma, stage unspecified: Secondary | ICD-10-CM

## 2023-04-27 DIAGNOSIS — Z2821 Immunization not carried out because of patient refusal: Secondary | ICD-10-CM | POA: Diagnosis not present

## 2023-04-27 DIAGNOSIS — E785 Hyperlipidemia, unspecified: Secondary | ICD-10-CM | POA: Diagnosis not present

## 2023-04-27 DIAGNOSIS — I7121 Aneurysm of the ascending aorta, without rupture: Secondary | ICD-10-CM

## 2023-04-27 DIAGNOSIS — R6882 Decreased libido: Secondary | ICD-10-CM

## 2023-04-27 DIAGNOSIS — N5201 Erectile dysfunction due to arterial insufficiency: Secondary | ICD-10-CM | POA: Diagnosis not present

## 2023-04-27 DIAGNOSIS — Z0000 Encounter for general adult medical examination without abnormal findings: Secondary | ICD-10-CM

## 2023-04-27 LAB — LIPID PANEL

## 2023-04-27 NOTE — Addendum Note (Signed)
 Addended by: Ronnald Nian on: 04/27/2023 04:44 PM   Modules accepted: Level of Service

## 2023-04-27 NOTE — Progress Notes (Signed)
 She  Annual Wellness Visit     Patient: Jon Morales, Male    DOB: [birth year], 78 y.o.   MRN: [id number]  Subjective  Chief Complaint  Patient presents with   Medical Management of Chronic Issues    Med check plus- no CPE, hungry all the time, sex drive gone    Jon Morales is a 78 y.o. male who presents today for his Annual Wellness Visit. He reports consuming a general diet. Gym/ health club routine includes machines. And cardi  He generally feels well. He reports sleeping poorly. He does have underlying coronary artery disease with elevated coronary calcium score.  He has a follow-up appoint with cardiology in the near future.  He does have glaucoma and follows up regularly with ophthalmology.  He has noted decreased libido recently as well as some ED. and would like to follow-up on that.  He also has x-ray evidence of aortic aneurysm as well as atherosclerosis.  HPI       Medications: Outpatient Medications Prior to Visit  Medication Sig   latanoprost (XALATAN) 0.005 % ophthalmic solution    metoprolol succinate (TOPROL-XL) 25 MG 24 hr tablet Take 1 tablet (25 mg total) by mouth daily. Please call [phone] to schedule an overdue appointment for future refills. Thank  you. 1st attempt.   tadalafil (CIALIS) 20 MG tablet Take 1 tablet by mouth daily as needed for erectile dysfunction.   No facility-administered medications prior to visit.    Allergies  Allergen Reactions   Levaquin [Levofloxacin In D5w] Shortness Of Breath   Sulfa Drugs Cross Reactors     childhood    Patient Care Team: Ronnald Nian, MD as PCP - General (Family Medicine) Janalyn Harder, MD (Inactive) as Consulting Physician (Dermatology)  Review of Systems  All other systems reviewed and are negative.       Objective  BP 120/70   Pulse 75   Ht 5\' 10"  (1.778 m)   Wt 170 lb 3.2 oz (77.2 kg)   BMI 24.42 kg/m    Physical Exam  Alert and in no distress. Tympanic membranes and canals are  normal. Pharyngeal area is normal. Neck is supple without adenopathy or thyromegaly. Cardiac exam shows a regular sinus rhythm without murmurs or gallops. Lungs are clear to auscultation. Suzette Battiest has a dinner but it is in there somewhere  Most recent functional status assessment:    04/27/2023    2:04 PM  In your present state of health, do you have any difficulty performing the following activities:  Hearing? 0  Vision? 0  Difficulty concentrating or making decisions? 0  Walking or climbing stairs? 0  Dressing or bathing? 0  Doing errands, shopping? 0  Preparing Food and eating ? N  Using the Toilet? N  In the past six months, have you accidently leaked urine? N  Do you have problems with loss of bowel control? N  Managing your Medications? N  Managing your Finances? N  Housekeeping or managing your Housekeeping? N   Most recent fall risk assessment:    04/27/2023    1:58 PM  Fall Risk   Falls in the past year? 0  Number falls in past yr: 0  Injury with Fall? 0  Risk for fall due to : No Fall Risks  Follow up Falls evaluation completed    Most recent depression screenings:    04/27/2023    1:58 PM 12/10/2021   10:50 AM  PHQ 2/9 Scores  PHQ -  2 Score 0 0   Most recent cognitive screening:    04/27/2023    2:05 PM  6CIT Screen  What Year? 0 points  What month? 0 points  What time? 0 points  Count back from 20 0 points  Months in reverse 0 points  Repeat phrase 0 points  Total Score 0 points   Most recent Audit-C alcohol use screening    04/24/2023    7:52 AM  Alcohol Use Disorder Test (AUDIT)  1. How often do you have a drink containing alcohol? 4  2. How many drinks containing alcohol do you have on a typical day when you are drinking? 0  3. How often do you have six or more drinks on one occasion? 0  AUDIT-C Score 4   4. How often during the last year have you found that you were not able to stop drinking once you had started? 0  5. How often during the  last year have you failed to do what was normally expected from you because of drinking? 0  6. How often during the last year have you needed a first drink in the morning to get yourself going after a heavy drinking session? 0  7. How often during the last year have you had a feeling of guilt of remorse after drinking? 1  8. How often during the last year have you been unable to remember what happened the night before because you had been drinking? 0  9. Have you or someone else been injured as a result of your drinking? 0  10. Has a relative or friend or a doctor or another health worker been concerned about your drinking or suggested you cut down? 0  Alcohol Use Disorder Identification Test Final Score (AUDIT) 5      Patient-reported   A score of 3 or more in women, and 4 or more in men indicates increased risk for alcohol abuse, EXCEPT if all of the points are from question 1   Vision/Hearing Screen: No results found.       Assessment & Plan  Aneurysm of ascending aorta without rupture (HCC)  Atherosclerosis of aorta (HCC)  Erectile dysfunction due to arterial insufficiency  Immunization refused  Hyperlipidemia with target low density lipoprotein (LDL) cholesterol less than 130 mg/dL - Plan: Lipid panel  Open-angle glaucoma, unspecified glaucoma stage, unspecified laterality, unspecified open-angle glaucoma type  Coronary artery disease involving native coronary artery of native heart without angina pectoris - Plan: CBC with Differential/Platelet, Comprehensive metabolic panel  Low libido - Plan: Testosterone  Annual wellness visit done today including the all of the following: Reviewed patient's Family Medical History Reviewed and updated list of patient's medical providers Assessment of cognitive impairment was done Assessed patient's functional ability Established a written schedule for health screening services Health Risk Assessent Completed and Reviewed  Exercise  Activities and Dietary recommendations  Goals   None      There is no immunization history on file for this patient.  Health Maintenance  Topic Date Due   DTaP/Tdap/Td (1 - Tdap) Never done   Zoster Vaccines- Shingrix (1 of 2) Never done   INFLUENZA VACCINE  Never done   Medicare Annual Wellness (AWV)  04/26/2024   Colonoscopy  04/02/2030   Hepatitis C Screening  Completed   HPV VACCINES  Aged Out   Pneumonia Vaccine 19+ Years old  Discontinued   COVID-19 Vaccine  Discontinued     Discussed health benefits of physical activity, and  encouraged him to engage in regular exercise appropriate for his age and condition.    Problem List Items Addressed This Visit     Aneurysm of ascending aorta without rupture (HCC) - Primary   Atherosclerosis of aorta (HCC)   Coronary artery disease involving native coronary artery of native heart without angina pectoris   Relevant Orders   CBC with Differential/Platelet   Comprehensive metabolic panel   Erectile dysfunction   Hyperlipidemia with target low density lipoprotein (LDL) cholesterol less than 130 mg/dL   Relevant Orders   Lipid panel   Immunization refused   Open-angle glaucoma   Other Visit Diagnoses       Low libido       Relevant Orders   Testosterone       Return in about 1 year (around 04/26/2024).     Sharlot Gowda, MD

## 2023-04-28 ENCOUNTER — Encounter: Payer: Self-pay | Admitting: Family Medicine

## 2023-04-28 LAB — CBC WITH DIFFERENTIAL/PLATELET
Basophils Absolute: 0.1 10*3/uL (ref 0.0–0.2)
Basos: 1 %
EOS (ABSOLUTE): 0.2 10*3/uL (ref 0.0–0.4)
Eos: 2 %
Hematocrit: 45.4 % (ref 37.5–51.0)
Hemoglobin: 15.7 g/dL (ref 13.0–17.7)
Immature Grans (Abs): 0 10*3/uL (ref 0.0–0.1)
Immature Granulocytes: 0 %
Lymphocytes Absolute: 2.6 10*3/uL (ref 0.7–3.1)
Lymphs: 37 %
MCH: 31.1 pg (ref 26.6–33.0)
MCHC: 34.6 g/dL (ref 31.5–35.7)
MCV: 90 fL (ref 79–97)
Monocytes Absolute: 0.7 10*3/uL (ref 0.1–0.9)
Monocytes: 10 %
Neutrophils Absolute: 3.3 10*3/uL (ref 1.4–7.0)
Neutrophils: 50 %
Platelets: 285 10*3/uL (ref 150–450)
RBC: 5.05 x10E6/uL (ref 4.14–5.80)
RDW: 12 % (ref 11.6–15.4)
WBC: 6.9 10*3/uL (ref 3.4–10.8)

## 2023-04-28 LAB — LIPID PANEL
Cholesterol, Total: 208 mg/dL — ABNORMAL HIGH (ref 100–199)
HDL: 71 mg/dL (ref >39)
LDL CALC COMMENT:: 2.9 ratio (ref 0.0–5.0)
LDL Chol Calc (NIH): 116 mg/dL — ABNORMAL HIGH (ref 0–99)
Triglycerides: 119 mg/dL (ref 0–149)
VLDL Cholesterol Cal: 21 mg/dL (ref 5–40)

## 2023-04-28 LAB — COMPREHENSIVE METABOLIC PANEL
ALT: 46 IU/L — ABNORMAL HIGH (ref 0–44)
AST: 33 IU/L (ref 0–40)
Albumin: 4.9 g/dL — ABNORMAL HIGH (ref 3.8–4.8)
Alkaline Phosphatase: 49 IU/L (ref 44–121)
BUN/Creatinine Ratio: 16 (ref 10–24)
BUN: 17 mg/dL (ref 8–27)
Bilirubin Total: 0.4 mg/dL (ref 0.0–1.2)
CO2: 22 mmol/L (ref 20–29)
Calcium: 9.5 mg/dL (ref 8.6–10.2)
Chloride: 102 mmol/L (ref 96–106)
Creatinine, Ser: 1.05 mg/dL (ref 0.76–1.27)
Globulin, Total: 2.5 g/dL (ref 1.5–4.5)
Glucose: 86 mg/dL (ref 70–99)
Potassium: 4.3 mmol/L (ref 3.5–5.2)
Sodium: 141 mmol/L (ref 134–144)
Total Protein: 7.4 g/dL (ref 6.0–8.5)
eGFR: 74 mL/min/{1.73_m2} (ref >59)

## 2023-04-28 LAB — TESTOSTERONE: Testosterone: 322 ng/dL (ref 264–916)

## 2023-06-08 ENCOUNTER — Telehealth: Payer: Self-pay | Admitting: Family Medicine

## 2023-06-08 NOTE — Telephone Encounter (Signed)
 Ok for a referral. I do not see anything recently mentioned at recent visit with you

## 2023-06-08 NOTE — Telephone Encounter (Signed)
 Copied from CRM [id number]. Topic: Referral - Request for Referral >> Jun 08, 2023 12:35 PM Rosaria Common wrote: Did the patient discuss referral with their provider in the last year? Yes (If No - schedule appointment) (If Yes - send message)  Appointment offered? No  Type of order/referral and detailed reason for visit: ENT  Preference of office, provider, location: Dr. Darlin Ehrlich, [phone]  If referral order, have you been seen by this specialty before? No (If Yes, this issue or another issue? When? Where?  Can we respond through MyChart? Yes

## 2023-07-26 DIAGNOSIS — H401131 Primary open-angle glaucoma, bilateral, mild stage: Secondary | ICD-10-CM | POA: Diagnosis not present

## 2023-07-26 DIAGNOSIS — H35343 Macular cyst, hole, or pseudohole, bilateral: Secondary | ICD-10-CM | POA: Diagnosis not present

## 2023-07-26 DIAGNOSIS — Z961 Presence of intraocular lens: Secondary | ICD-10-CM | POA: Diagnosis not present

## 2023-07-26 DIAGNOSIS — H04123 Dry eye syndrome of bilateral lacrimal glands: Secondary | ICD-10-CM | POA: Diagnosis not present

## 2023-10-19 ENCOUNTER — Encounter: Payer: Self-pay | Admitting: Family Medicine

## 2023-10-19 ENCOUNTER — Ambulatory Visit: Admitting: Family Medicine

## 2023-10-19 VITALS — BP 124/70 | HR 68 | Ht 70.0 in | Wt 173.8 lb

## 2023-10-19 DIAGNOSIS — N5201 Erectile dysfunction due to arterial insufficiency: Secondary | ICD-10-CM

## 2023-10-19 DIAGNOSIS — R5383 Other fatigue: Secondary | ICD-10-CM | POA: Diagnosis not present

## 2023-10-19 DIAGNOSIS — K219 Gastro-esophageal reflux disease without esophagitis: Secondary | ICD-10-CM | POA: Diagnosis not present

## 2023-10-19 DIAGNOSIS — R6882 Decreased libido: Secondary | ICD-10-CM

## 2023-10-19 NOTE — Progress Notes (Signed)
   Subjective:    Patient ID: Jon Morales, male    DOB: [birth year], 78 y.o.   MRN: [id number]  Discussed the use of AI scribe software for clinical note transcription with the patient, who gave verbal consent to proceed.  History of Present Illness   Jon Morales Dr Octaviano is a 78 year old male who presents with chronic hoarseness and cough.  He has been experiencing chronic hoarseness and a dry cough, which he attributes to irritation around the vocal cord area. The symptoms are persistent but fluctuate, with a history of vocal cord issues. He occasionally uses proton pump inhibitors like Prilosec or Nexium, but not regularly. Certain foods or drinks, such as an Old Fashioned cocktail, exacerbate the burning sensation in his throat. He also experiences difficulty swallowing vitamins, leading to a sensation of them getting stuck and causing burping.  His hoarseness and cough are more pronounced in the mornings, especially after drinking coffee, which he finds irritating. He experiences referred pain to the C7-T1 area.  He reports a significant decrease in libido over the past three months, associating it with low testosterone  levels previously noted. He has tried medications like tadalafil  and sildenafil  for erectile dysfunction but experiences severe reflux as a side effect.  He experiences frequent nighttime urination and fatigue.           Review of Systems     Objective:    Physical Exam Tympanic membrane is normal.  Throat is clear.  Neck is supple without adenopathy or thyromegaly.           Assessment & Plan:  Chronic hoarseness and cough likely due to gastroesophageal reflux disease (GERD)  - Initiate double dosing of Prilosec or Nexium for two weeks to assess improvement. - Consider ENT referral if symptoms persist post-medication trial. - Advise avoiding triggers like coffee and certain medications. - If symptoms improve, consider tapering PPI and switching to Pepcid   or Zantac.  Low libido - Consider psychological factors and lifestyle changes as contributors.   Testosterone  was attempted to be ordered but could not get it qualified.  He will get the test himself and let me know.

## 2023-11-21 DIAGNOSIS — I788 Other diseases of capillaries: Secondary | ICD-10-CM | POA: Diagnosis not present

## 2023-11-21 DIAGNOSIS — L570 Actinic keratosis: Secondary | ICD-10-CM | POA: Diagnosis not present

## 2023-11-21 DIAGNOSIS — L814 Other melanin hyperpigmentation: Secondary | ICD-10-CM | POA: Diagnosis not present

## 2023-11-21 DIAGNOSIS — D1801 Hemangioma of skin and subcutaneous tissue: Secondary | ICD-10-CM | POA: Diagnosis not present

## 2023-11-21 DIAGNOSIS — L821 Other seborrheic keratosis: Secondary | ICD-10-CM | POA: Diagnosis not present

## 2023-12-06 DIAGNOSIS — H401131 Primary open-angle glaucoma, bilateral, mild stage: Secondary | ICD-10-CM | POA: Diagnosis not present

## 2023-12-06 DIAGNOSIS — Z961 Presence of intraocular lens: Secondary | ICD-10-CM | POA: Diagnosis not present

## 2023-12-06 DIAGNOSIS — H04123 Dry eye syndrome of bilateral lacrimal glands: Secondary | ICD-10-CM | POA: Diagnosis not present

## 2024-01-29 ENCOUNTER — Telehealth: Admitting: Family Medicine

## 2024-01-29 DIAGNOSIS — B9689 Other specified bacterial agents as the cause of diseases classified elsewhere: Secondary | ICD-10-CM

## 2024-01-29 DIAGNOSIS — J019 Acute sinusitis, unspecified: Secondary | ICD-10-CM | POA: Diagnosis not present

## 2024-01-29 MED ORDER — AMOXICILLIN-POT CLAVULANATE 875-125 MG PO TABS: 1.0000 | ORAL_TABLET | Freq: Two times a day (BID) | ORAL | 0 refills | Status: AC

## 2024-01-29 NOTE — Progress Notes (Signed)
 Virtual Visit Consent   Jon Morales, you are scheduled for a virtual visit with a [facility] provider today. Just as with appointments in the office, your consent must be obtained to participate. Your consent will be active for this visit and any virtual visit you may have with one of our providers in the next 365 days. If you have a MyChart account, a copy of this consent can be sent to you electronically.  As this is a virtual visit, video technology does not allow for your provider to perform a traditional examination. This may limit your provider's ability to fully assess your condition. If your provider identifies any concerns that need to be evaluated in person or the need to arrange testing (such as labs, EKG, etc.), we will make arrangements to do so. Although advances in technology are sophisticated, we cannot ensure that it will always work on either your end or our end. If the connection with a video visit is poor, the visit may have to be switched to a telephone visit. With either a video or telephone visit, we are not always able to ensure that we have a secure connection.  By engaging in this virtual visit, you consent to the provision of healthcare and authorize for your insurance to be billed (if applicable) for the services provided during this visit. Depending on your insurance coverage, you may receive a charge related to this service.  I need to obtain your verbal consent now. Are you willing to proceed with your visit today? Jon Morales has provided verbal consent on 01/29/2024 for a virtual visit (video or telephone). Chiquita CHRISTELLA Barefoot, NP  Date: 01/29/2024 3:17 PM   Virtual Visit via Video Note   I, Chiquita CHRISTELLA Barefoot, connected with  Jon Morales  ([id number], 1945-12-02) on 01/29/2024 at  3:15 PM EST by a video-enabled telemedicine application and verified that I am speaking with the correct person using two identifiers.  Location: Patient: Virtual Visit Location Patient:  Home Provider: Virtual Visit Location Provider: Home Office   I discussed the limitations of evaluation and management by telemedicine and the availability of in person appointments. The patient expressed understanding and agreed to proceed.    History of Present Illness: Jon Morales is a 78 y.o. who identifies as a male who was assigned male at birth, and is being seen today for sinus congestion  Onset was 2 weeks ago- nights are worse time Symptoms include frontal sinus pain, right ear pain, congestion is bloody red. Modifying factors are saline spray nasal, ibuprofen Denies chest pain, shortness of breath, fevers, chills, cough  Exposure to sick contacts- unknown COVID test: no Vaccines: none   Problems:  Patient Active Problem List   Diagnosis Date Noted   Coronary artery disease involving native coronary artery of native heart without angina pectoris 04/26/2023   Open-angle glaucoma 04/26/2023   Aneurysm of ascending aorta without rupture 12/10/2021   Immunization refused 12/10/2021   Atherosclerosis of aorta 05/18/2021   Erectile dysfunction 05/18/2021   Hiatal hernia 04/03/2020   Internal hemorrhoids 04/03/2020   Diverticulosis of colon 04/03/2020   Hyperlipidemia with target low density lipoprotein (LDL) cholesterol less than 130 mg/dL 90/80/7985   Inguinal hernia unilateral, non-recurrent 10/19/2010    Allergies: Allergies[1] Medications: Current Medications[2]  Observations/Objective: Patient is well-developed, well-nourished in no acute distress.  Resting comfortably  at home.  Head is normocephalic, atraumatic.  No labored breathing.  Speech is clear and coherent with logical content.  Patient is alert and oriented at baseline.    Assessment and Plan: 1. Acute bacterial sinusitis (Primary)  - amoxicillin -clavulanate (AUGMENTIN ) 875-125 MG tablet; Take 1 tablet by mouth 2 (two) times daily for 7 days.  Dispense: 14 tablet; Refill: 0  - Increased rest -  Increasing Fluids - Acetaminophen  / ibuprofen as needed for pain.  - Saline nasal spray if congestion or if nasal passages feel dry. - Humidifying the air.    Reviewed side effects, risks and benefits of medication.    Patient acknowledged agreement and understanding of the plan.   Past Medical, Surgical, Social History, Allergies, and Medications have been Reviewed.     Follow Up Instructions: I discussed the assessment and treatment plan with the patient. The patient was provided an opportunity to ask questions and all were answered. The patient agreed with the plan and demonstrated an understanding of the instructions.  A copy of instructions were sent to the patient via MyChart unless otherwise noted below.    The patient was advised to call back or seek an in-person evaluation if the symptoms worsen or if the condition fails to improve as anticipated.    Chiquita CHRISTELLA Barefoot, NP     [1]  Allergies Allergen Reactions   Levaquin  [Levofloxacin  In D5w] Shortness Of Breath   Sulfa Drugs Cross Reactors     childhood  [2]  Current Outpatient Medications:    latanoprost (XALATAN) 0.005 % ophthalmic solution, , Disp: , Rfl:    metoprolol  succinate (TOPROL -XL) 25 MG 24 hr tablet, Take 1 tablet (25 mg total) by mouth daily. Please call [phone] to schedule an overdue appointment for future refills. Thank  you. 1st attempt., Disp: 30 tablet, Rfl: 0   tadalafil  (CIALIS ) 20 MG tablet, Take 1 tablet by mouth daily as needed for erectile dysfunction., Disp: 30 tablet, Rfl: 0

## 2024-01-29 NOTE — Patient Instructions (Addendum)
°  Lamar GORMAN Platter, thank you for joining Chiquita CHRISTELLA Barefoot, NP for today's virtual visit.  While this provider is not your primary care provider (PCP), if your PCP is located in our provider database this encounter information will be shared with them immediately following your visit.   A [facility] MyChart account gives you access to today's visit and all your visits, tests, and labs performed at [facility]  click here if you don't have a [facility] MyChart account or go to [email or web address]  Consent: (Patient) Jon Morales provided verbal consent for this virtual visit at the beginning of the encounter.  Current Medications:  Current Outpatient Medications:    amoxicillin -clavulanate (AUGMENTIN ) 875-125 MG tablet, Take 1 tablet by mouth 2 (two) times daily for 7 days., Disp: 14 tablet, Rfl: 0   latanoprost (XALATAN) 0.005 % ophthalmic solution, , Disp: , Rfl:    metoprolol  succinate (TOPROL -XL) 25 MG 24 hr tablet, Take 1 tablet (25 mg total) by mouth daily. Please call [phone] to schedule an overdue appointment for future refills. Thank  you. 1st attempt., Disp: 30 tablet, Rfl: 0   tadalafil  (CIALIS ) 20 MG tablet, Take 1 tablet by mouth daily as needed for erectile dysfunction., Disp: 30 tablet, Rfl: 0   Medications ordered in this encounter:  Meds ordered this encounter  Medications   amoxicillin -clavulanate (AUGMENTIN ) 875-125 MG tablet    Sig: Take 1 tablet by mouth 2 (two) times daily for 7 days.    Dispense:  14 tablet    Refill:  0    Supervising Provider:   BLAISE ALEENE KIDD [[id number]]     *If you need refills on other medications prior to your next appointment, please contact your pharmacy*  Follow-Up: Call back or seek an in-person evaluation if the symptoms worsen or if the condition fails to improve as anticipated.  [facility] [phone]  Other Instructions  - Increased rest - Increasing Fluids - Acetaminophen  / ibuprofen as  needed for pain.  - Saline nasal spray if congestion or if nasal passages feel dry. - Humidifying the air.    If you have been instructed to have an in-person evaluation today at a local Urgent Care facility, please use the link below. It will take you to a list of all of our available [facility], including address, phone number and hours of operation. Please do not delay care.  [facility]  If you or a family member do not have a primary care provider, use the link below to schedule a visit and establish care. When you choose a [facility] primary care physician or advanced practice provider, you gain a long-term partner in health. Find a Primary Care Provider  Learn more about [facility]'s in-office and virtual care options: [facility] - Get Care Now

## 2024-02-23 ENCOUNTER — Telehealth: Admitting: Physician Assistant

## 2024-02-23 DIAGNOSIS — J019 Acute sinusitis, unspecified: Secondary | ICD-10-CM | POA: Diagnosis not present

## 2024-02-23 DIAGNOSIS — B9689 Other specified bacterial agents as the cause of diseases classified elsewhere: Secondary | ICD-10-CM

## 2024-02-23 MED ORDER — DOXYCYCLINE HYCLATE 100 MG PO TABS: 100.0000 mg | ORAL_TABLET | Freq: Two times a day (BID) | ORAL | 0 refills | Status: AC

## 2024-02-23 NOTE — Progress Notes (Signed)
 E-Visit for Sinus Problems  We are sorry that you are not feeling well.  Here is how we plan to help!  Based on what you have shared with me it looks like you have sinusitis.  Sinusitis is inflammation and infection in the sinus cavities of the head.  Based on your presentation I believe you most likely have Acute Bacterial Sinusitis.  This is an infection caused by bacteria and is treated with antibiotics. I have prescribed Doxycycline 100mg  by mouth twice a day for 7 days. You may use an oral decongestant such as Mucinex D or if you have glaucoma or high blood pressure use plain Mucinex. Saline nasal spray help and can safely be used as often as needed for congestion.  If you develop worsening sinus pain, fever or notice severe headache and vision changes, or if symptoms are not better after completion of antibiotic, please schedule an appointment with a health care provider.    Sinus infections are not as easily transmitted as other respiratory infection, however we still recommend that you avoid close contact with loved ones, especially the very young and elderly.  Remember to wash your hands thoroughly throughout the day as this is the number one way to prevent the spread of infection!  Home Care: Only take medications as instructed by your medical team. Complete the entire course of an antibiotic. Do not take these medications with alcohol. A steam or ultrasonic humidifier can help congestion.  You can place a towel over your head and breathe in the steam from hot water coming from a faucet. Avoid close contacts especially the very young and the elderly. Cover your mouth when you cough or sneeze. Always remember to wash your hands.  Get Help Right Away If: You develop worsening fever or sinus pain. You develop a severe head ache or visual changes. Your symptoms persist after you have completed your treatment plan.  Make sure you Understand these instructions. Will watch your  condition. Will get help right away if you are not doing well or get worse.  Your e-visit answers were reviewed by a board certified advanced clinical practitioner to complete your personal care plan.  Depending on the condition, your plan could have included both over the counter or prescription medications.  If there is a problem please reply  once you have received a response from your provider.  Your safety is important to us .  If you have drug allergies check your prescription carefully.    You can use MyChart to ask questions about today's visit, request a non-urgent call back, or ask for a work or school excuse for 24 hours related to this e-Visit. If it has been greater than 24 hours you will need to follow up with your provider, or enter a new e-Visit to address those concerns.  You will get an e-mail in the next two days asking about your experience.  I hope that your e-visit has been valuable and will speed your recovery. Thank you for using e-visits.  I have spent 5 minutes in review of e-visit questionnaire, review and updating patient chart, medical decision making and response to patient.   Delon CHRISTELLA Dickinson, PA-C

## 2024-03-04 ENCOUNTER — Other Ambulatory Visit: Payer: Self-pay | Admitting: Cardiology

## 2024-03-04 DIAGNOSIS — I4729 Other ventricular tachycardia: Secondary | ICD-10-CM

## 2024-03-11 MED ORDER — METOPROLOL SUCCINATE ER 25 MG PO TB24: 25.0000 mg | ORAL_TABLET | Freq: Every day | ORAL | 0 refills | Status: DC

## 2024-03-11 MED ORDER — METOPROLOL SUCCINATE ER 25 MG PO TB24: 25.0000 mg | ORAL_TABLET | Freq: Every day | ORAL | 1 refills | Status: AC

## 2024-03-11 NOTE — Telephone Encounter (Signed)
Patient has upcoming appointment in March.

## 2024-05-02 ENCOUNTER — Ambulatory Visit: Admitting: Cardiovascular Disease
